# Patient Record
Sex: Female | Born: 1975 | Race: Black or African American | Hispanic: No | Marital: Single | Smoking: Never smoker
Health system: Southern US, Community
[De-identification: ages and names within clinical notes are randomized; demographics above are authoritative.]

## PROBLEM LIST (undated history)

## (undated) ENCOUNTER — Emergency Department (HOSPITAL_BASED_OUTPATIENT_CLINIC_OR_DEPARTMENT_OTHER)
Discharge status: Absent without leave | Payer: BC Managed Care – PPO | Source: Home / Self Care | Attending: Emergency Medicine | Admitting: Emergency Medicine

## (undated) DIAGNOSIS — Z9889 Other specified postprocedural states: Secondary | ICD-10-CM

## (undated) DIAGNOSIS — T7840XA Allergy, unspecified, initial encounter: Secondary | ICD-10-CM

## (undated) DIAGNOSIS — R51 Headache: Secondary | ICD-10-CM

## (undated) DIAGNOSIS — R519 Headache, unspecified: Secondary | ICD-10-CM

## (undated) DIAGNOSIS — D649 Anemia, unspecified: Secondary | ICD-10-CM

## (undated) DIAGNOSIS — E282 Polycystic ovarian syndrome: Secondary | ICD-10-CM

## (undated) DIAGNOSIS — J45909 Unspecified asthma, uncomplicated: Secondary | ICD-10-CM

## (undated) DIAGNOSIS — G4730 Sleep apnea, unspecified: Secondary | ICD-10-CM

## (undated) DIAGNOSIS — R42 Dizziness and giddiness: Secondary | ICD-10-CM

## (undated) DIAGNOSIS — R112 Nausea with vomiting, unspecified: Secondary | ICD-10-CM

## (undated) HISTORY — DX: Polycystic ovarian syndrome: E28.2

## (undated) HISTORY — DX: Allergy, unspecified, initial encounter: T78.40XA

---

## 2009-04-02 HISTORY — PX: MYOMECTOMY: SHX85

## 2014-04-16 DIAGNOSIS — E282 Polycystic ovarian syndrome: Secondary | ICD-10-CM | POA: Insufficient documentation

## 2014-10-07 DIAGNOSIS — O4290 Premature rupture of membranes, unspecified as to length of time between rupture and onset of labor, unspecified weeks of gestation: Secondary | ICD-10-CM

## 2014-11-18 ENCOUNTER — Emergency Department (HOSPITAL_COMMUNITY)
Admission: EM | Admit: 2014-11-18 | Discharge: 2014-11-18 | Disposition: A | Discharge status: Home / Self Care | Payer: BLUE CROSS/BLUE SHIELD | Attending: Emergency Medicine | Admitting: Emergency Medicine

## 2014-11-18 ENCOUNTER — Encounter (HOSPITAL_COMMUNITY): Payer: Self-pay | Admitting: Emergency Medicine

## 2014-11-18 DIAGNOSIS — H9209 Otalgia, unspecified ear: Secondary | ICD-10-CM | POA: Insufficient documentation

## 2014-11-18 DIAGNOSIS — J0101 Acute recurrent maxillary sinusitis: Secondary | ICD-10-CM | POA: Diagnosis not present

## 2014-11-18 DIAGNOSIS — R05 Cough: Secondary | ICD-10-CM | POA: Diagnosis present

## 2014-11-18 DIAGNOSIS — J45909 Unspecified asthma, uncomplicated: Secondary | ICD-10-CM | POA: Insufficient documentation

## 2014-11-18 HISTORY — DX: Unspecified asthma, uncomplicated: J45.909

## 2014-11-18 MED ORDER — FEXOFENADINE-PSEUDOEPHED ER 60-120 MG PO TB12: 1.0000 | ORAL_TABLET | Freq: Two times a day (BID) | ORAL | Status: DC

## 2014-11-18 NOTE — Discharge Instructions (Signed)
Please take the Allegra-D as directed.  This is a decongestion.  Will trial help your pressure and a feeling of stuffiness in your ears make an appointment with Dr. Constance Holster for further evaluation current symptoms

## 2014-11-18 NOTE — ED Notes (Signed)
Pt arrived to the ED with a complaint of a sinus infection.  Pt states that she has had symptoms for a week.  Pt states that she feels if not caught she usually will develop a URI.  Pt states she has a cough.

## 2014-11-18 NOTE — ED Provider Notes (Signed)
CSN: [id number]     Arrival date & time 11/18/14  2242 History  This chart was scribed for non-physician practitioner, Garald Balding, NP, working with Everlene Balls, MD, by Helane Gunther ED Scribe. This patient was seen in room WTR8/WTR8 and the patient's care was started at 11:20 PM    Chief Complaint  Patient presents with  . Cough   The history is provided by the patient. No language interpreter was used.   HPI Comments: Daisy Baker is a 39 y.o. female who presents to the Emergency Department complaining of a cough onset 1 week ago. She reports associated rhinorrhea, congestion, and ear ache. She has taken zyrtec at first with no relief, and then Alegra with some relief. Pt denies fever or discolored nasal discharge.   Past Medical History  Diagnosis Date  . Asthma    No past surgical history on file. No family history on file. Social History  Substance Use Topics  . Smoking status: Never Smoker   . Smokeless tobacco: Never Used  . Alcohol Use: Yes   OB History    No data available     Review of Systems  Constitutional: Negative for fever and chills.  HENT: Positive for congestion, postnasal drip and sinus pressure.   Respiratory: Positive for cough. Negative for shortness of breath and wheezing.     Allergies  Review of patient's allergies indicates no known allergies.  Home Medications   Prior to Admission medications   Medication Sig Start Date End Date Taking? Authorizing Provider  fexofenadine-pseudoephedrine (ALLEGRA-D) 60-120 MG per tablet Take 1 tablet by mouth every 12 (twelve) hours. 11/18/14   Junius Creamer, NP   BP 146/80 mmHg  Pulse 98  Temp(Src) 98.1 F (36.7 C) (Oral)  Resp 21  SpO2 95%  LMP 11/14/2014 (Exact Date) Physical Exam  Constitutional: She appears well-nourished.  HENT:  Head: Normocephalic.  Right Ear: External ear normal.  Left Ear: External ear normal.  Eyes: Pupils are equal, round, and reactive to light.  Neck: Normal range  of motion.  Cardiovascular: Normal rate.   Pulmonary/Chest: Effort normal. No respiratory distress.  Musculoskeletal: Normal range of motion.  Neurological: She is alert.  Skin: Skin is warm and dry.  Nursing note and vitals reviewed.   ED Course  Procedures  DIAGNOSTIC STUDIES: Oxygen Saturation is 95% on RA, adequate by my interpretation.    COORDINATION OF CARE: 11:23 PM - Discussed plans to discharge. Pt advised of plan for treatment and pt agrees.  Labs Review Labs Reviewed - No data to display  Imaging Review No results found. I have personally reviewed and evaluated these images and lab results as part of my medical decision-making.   EKG Interpretation None      MDM   Final diagnoses:  Acute recurrent maxillary sinusitis   I personally performed the services described in this documentation, which was scribed in my presence. The recorded information has been reviewed and is accurate.  Junius Creamer, NP 11/18/14 1224  Everlene Balls, MD 11/19/14 [phone]

## 2015-05-09 ENCOUNTER — Encounter: Payer: Self-pay | Admitting: *Deleted

## 2015-05-10 ENCOUNTER — Ambulatory Visit (INDEPENDENT_AMBULATORY_CARE_PROVIDER_SITE_OTHER): Payer: Medicaid Other | Admitting: Obstetrics & Gynecology

## 2015-05-10 ENCOUNTER — Encounter: Payer: Self-pay | Admitting: Obstetrics & Gynecology

## 2015-05-10 VITALS — BP 126/80 | HR 62 | Ht 63.0 in | Wt 283.0 lb

## 2015-05-10 DIAGNOSIS — D259 Leiomyoma of uterus, unspecified: Secondary | ICD-10-CM | POA: Diagnosis not present

## 2015-05-10 DIAGNOSIS — N938 Other specified abnormal uterine and vaginal bleeding: Secondary | ICD-10-CM | POA: Diagnosis not present

## 2015-05-10 DIAGNOSIS — Z3046 Encounter for surveillance of implantable subdermal contraceptive: Secondary | ICD-10-CM | POA: Diagnosis not present

## 2015-05-10 MED ORDER — NORGESTREL-ETHINYL ESTRADIOL 0.3-30 MG-MCG PO TABS: 1.0000 | ORAL_TABLET | Freq: Every day | ORAL | Status: DC

## 2015-05-10 NOTE — Progress Notes (Signed)
   Subjective:    Patient ID: Daisy Baker, female    DOB: 1975/10/22, 40 y.o.   MRN: [id number]  HPI 30 S AA P1 (58 and 58 month old daughters) here as a referral from the health dept for evaluation of fibroids. She was seen there last month for the issue of irregular bleeding with Nexplanon (placed right after her PLTCS.  She is now only having light irregular bleeding. She has a h/o fibroids and had a [place]omectomy in [place] in [place].  She has always had irregular bleeding, presumably due to her fibroids. She has some "contraction like pain" at times, worse with her periods. She denies dyspareunia. She is frankly quite tired of bleeding all the time.  Review of Systems She had a normal pap smear in [facility] 2016    Objective:   Physical Exam        Assessment & Plan:  Status of fibroids- will need gyn u/s Check TSH Contraception- continue Nexplanon for now, add lo ovral prn Recheck BP in 2 weeks I offered her BTL Consider [place] depending on u/s results

## 2015-05-10 NOTE — Progress Notes (Signed)
Pt here today to follow-up uterine fibroids that are causing pelvic pain and pt is c/o irregular menstrual cycles for the past 3 months.

## 2015-05-11 LAB — TSH: TSH: 0.67 mIU/L

## 2015-05-16 ENCOUNTER — Encounter: Payer: BLUE CROSS/BLUE SHIELD | Admitting: Obstetrics & Gynecology

## 2015-05-18 ENCOUNTER — Ambulatory Visit (HOSPITAL_COMMUNITY)
Admission: RE | Admit: 2015-05-18 | Discharge: 2015-05-18 | Disposition: A | Discharge status: Home / Self Care | Payer: Medicaid Other | Source: Ambulatory Visit | Attending: Obstetrics & Gynecology | Admitting: Obstetrics & Gynecology

## 2015-05-18 DIAGNOSIS — N938 Other specified abnormal uterine and vaginal bleeding: Secondary | ICD-10-CM | POA: Diagnosis present

## 2015-05-18 DIAGNOSIS — D259 Leiomyoma of uterus, unspecified: Secondary | ICD-10-CM

## 2015-05-18 DIAGNOSIS — D252 Subserosal leiomyoma of uterus: Secondary | ICD-10-CM | POA: Diagnosis not present

## 2015-06-10 ENCOUNTER — Encounter: Payer: Self-pay | Admitting: Obstetrics & Gynecology

## 2015-06-10 ENCOUNTER — Ambulatory Visit (INDEPENDENT_AMBULATORY_CARE_PROVIDER_SITE_OTHER): Payer: Medicaid Other | Admitting: Obstetrics & Gynecology

## 2015-06-10 VITALS — BP 118/89 | HR 81 | Resp 16 | Ht 63.0 in | Wt 276.0 lb

## 2015-06-10 DIAGNOSIS — Z3009 Encounter for other general counseling and advice on contraception: Secondary | ICD-10-CM | POA: Diagnosis not present

## 2015-06-10 DIAGNOSIS — N938 Other specified abnormal uterine and vaginal bleeding: Secondary | ICD-10-CM

## 2015-06-10 DIAGNOSIS — D252 Subserosal leiomyoma of uterus: Secondary | ICD-10-CM

## 2015-06-10 NOTE — Progress Notes (Signed)
   Subjective:    Patient ID: Daisy Baker, female    DOB: 01-09-1976, 40 y.o.   MRN: [id number]  HPI  40 yo S AAP2 here for follow up after her u/s done for DUB. It showed a 1.8 cm subserosal fibroid, 13 cm uterus. She is taking the Lo ovral on top of the Nexplanon and is not bleeding!  Review of Systems     Objective:   Physical Exam  WNWHBFNAD Abd- benign      Assessment & Plan:  Desire for sterilization- sign MCD forms and schedule l/s Filsche clip application in [id number] DUB- plan for d&c, endometrial ablation. I am calling the reps to see which one would be better for her size uterus

## 2015-06-22 ENCOUNTER — Encounter (HOSPITAL_COMMUNITY): Payer: Self-pay | Admitting: *Deleted

## 2015-06-24 DIAGNOSIS — G4733 Obstructive sleep apnea (adult) (pediatric): Secondary | ICD-10-CM | POA: Insufficient documentation

## 2015-06-24 DIAGNOSIS — J302 Other seasonal allergic rhinitis: Secondary | ICD-10-CM | POA: Insufficient documentation

## 2015-07-19 ENCOUNTER — Ambulatory Visit (HOSPITAL_COMMUNITY): Admit: 2015-07-19 | Payer: Medicaid Other | Admitting: Obstetrics & Gynecology

## 2015-07-19 ENCOUNTER — Encounter (HOSPITAL_COMMUNITY): Payer: Self-pay

## 2015-07-19 SURGERY — LIGATION, FALLOPIAN TUBE, LAPAROSCOPIC
Anesthesia: Choice | Site: Vagina

## 2015-07-20 ENCOUNTER — Encounter (HOSPITAL_COMMUNITY): Payer: Self-pay | Admitting: *Deleted

## 2015-08-23 NOTE — Patient Instructions (Signed)
Your procedure is scheduled on:  Friday, September 02, 2015  Enter through the [employer] of [facility] at:  7:00 AM  Pick up the phone at the desk and dial [phone].  Call this number if you have problems the morning of surgery: [phone].  Remember: Do NOT eat food or drink after:  Midnight Thursday, September 01, 2015  Take these medicines the morning of surgery with a SIP OF WATER:  None  Do NOT wear jewelry (body piercing), metal hair clips/bobby pins, make-up, or nail polish. Do NOT wear lotions, powders, or perfumes.  You may wear deodorant. Do NOT shave for 48 hours prior to surgery. Do NOT bring valuables to the hospital. Contacts, dentures, or bridgework may not be worn into surgery.  Have a responsible adult drive you home and stay with you for 24 hours after your procedure

## 2015-08-24 ENCOUNTER — Inpatient Hospital Stay (HOSPITAL_COMMUNITY)
Admission: RE | Admit: 2015-08-24 | Discharge: 2015-08-24 | Disposition: A | Discharge status: Home / Self Care | Payer: Medicaid Other | Source: Ambulatory Visit

## 2015-08-31 ENCOUNTER — Encounter (HOSPITAL_COMMUNITY)
Admission: RE | Admit: 2015-08-31 | Discharge: 2015-08-31 | Disposition: A | Discharge status: Home / Self Care | Payer: BC Managed Care – PPO | Source: Ambulatory Visit | Attending: Obstetrics & Gynecology | Admitting: Obstetrics & Gynecology

## 2015-08-31 ENCOUNTER — Encounter (HOSPITAL_COMMUNITY): Payer: Self-pay

## 2015-08-31 DIAGNOSIS — Z01812 Encounter for preprocedural laboratory examination: Secondary | ICD-10-CM | POA: Diagnosis present

## 2015-08-31 HISTORY — DX: Sleep apnea, unspecified: G47.30

## 2015-08-31 HISTORY — DX: Headache: R51

## 2015-08-31 HISTORY — DX: Headache, unspecified: R51.9

## 2015-08-31 HISTORY — DX: Dizziness and giddiness: R42

## 2015-08-31 LAB — CBC
HCT: 37.3 % (ref 36.0–46.0)
Hemoglobin: 12.3 g/dL (ref 12.0–15.0)
MCH: 27.9 pg (ref 26.0–34.0)
MCHC: 33 g/dL (ref 30.0–36.0)
MCV: 84.6 fL (ref 78.0–100.0)
Platelets: 356 10*3/uL (ref 150–400)
RBC: 4.41 MIL/uL (ref 3.87–5.11)
RDW: 14.7 % (ref 11.5–15.5)
WBC: 7.1 10*3/uL (ref 4.0–10.5)

## 2015-08-31 NOTE — Patient Instructions (Signed)
Your procedure is scheduled on:  Friday, September 02, 2015  Enter through the [employer] of [facility] at:  7:00 AM  Pick up the phone at the desk and dial [phone].  Call this number if you have problems the morning of surgery: [phone].  Remember: Do NOT eat food or drink after:  Midnight Thursday  Take these medicines the morning of surgery with a SIP OF WATER:  None  Do NOT wear jewelry (body piercing), metal hair clips/bobby pins, make-up, or nail polish. Do NOT wear lotions, powders, or perfumes.  You may wear deodorant. Do NOT shave for 48 hours prior to surgery. Do NOT bring valuables to the hospital. Contacts, dentures, or bridgework may not be worn into surgery.  Have a responsible adult drive you home and stay with you for 24 hours after your procedure

## 2015-09-02 ENCOUNTER — Encounter (HOSPITAL_COMMUNITY): Payer: Self-pay | Admitting: Obstetrics & Gynecology

## 2015-09-02 ENCOUNTER — Ambulatory Visit (HOSPITAL_COMMUNITY)
Admission: RE | Admit: 2015-09-02 | Discharge: 2015-09-02 | Disposition: A | Discharge status: Home / Self Care | Payer: BC Managed Care – PPO | Source: Ambulatory Visit | Attending: Obstetrics & Gynecology | Admitting: Obstetrics & Gynecology

## 2015-09-02 ENCOUNTER — Ambulatory Visit (HOSPITAL_COMMUNITY): Payer: BC Managed Care – PPO | Admitting: Anesthesiology

## 2015-09-02 ENCOUNTER — Encounter (HOSPITAL_COMMUNITY)
Admission: RE | Disposition: A | Discharge status: Home / Self Care | Payer: Self-pay | Source: Ambulatory Visit | Attending: Obstetrics & Gynecology

## 2015-09-02 ENCOUNTER — Encounter (HOSPITAL_COMMUNITY): Payer: Self-pay | Admitting: Emergency Medicine

## 2015-09-02 DIAGNOSIS — G4730 Sleep apnea, unspecified: Secondary | ICD-10-CM | POA: Diagnosis not present

## 2015-09-02 DIAGNOSIS — Z302 Encounter for sterilization: Secondary | ICD-10-CM | POA: Diagnosis not present

## 2015-09-02 DIAGNOSIS — N938 Other specified abnormal uterine and vaginal bleeding: Secondary | ICD-10-CM | POA: Insufficient documentation

## 2015-09-02 DIAGNOSIS — J45909 Unspecified asthma, uncomplicated: Secondary | ICD-10-CM | POA: Diagnosis not present

## 2015-09-02 HISTORY — PX: DILITATION & CURRETTAGE/HYSTROSCOPY WITH NOVASURE ABLATION: SHX5568

## 2015-09-02 HISTORY — PX: NORPLANT REMOVAL: SHX5385

## 2015-09-02 HISTORY — PX: LAPAROSCOPIC TUBAL LIGATION: SHX1937

## 2015-09-02 LAB — PREGNANCY, URINE: Preg Test, Ur: NEGATIVE

## 2015-09-02 SURGERY — LIGATION, FALLOPIAN TUBE, LAPAROSCOPIC
Anesthesia: General | Site: Vagina

## 2015-09-02 MED ORDER — MIDAZOLAM HCL 2 MG/2ML IJ SOLN
INTRAMUSCULAR | Status: AC
  Filled 2015-09-02: qty 2

## 2015-09-02 MED ORDER — FENTANYL CITRATE (PF) 100 MCG/2ML IJ SOLN
INTRAMUSCULAR | Status: DC | PRN
  Administered 2015-09-02: 50 ug via INTRAVENOUS
  Administered 2015-09-02: 100 ug via INTRAVENOUS

## 2015-09-02 MED ORDER — LIDOCAINE HCL (CARDIAC) 20 MG/ML IV SOLN
INTRAVENOUS | Status: AC
  Filled 2015-09-02: qty 5

## 2015-09-02 MED ORDER — DEXAMETHASONE SODIUM PHOSPHATE 10 MG/ML IJ SOLN
INTRAMUSCULAR | Status: DC | PRN
  Administered 2015-09-02: 4 mg via INTRAVENOUS

## 2015-09-02 MED ORDER — BUPIVACAINE HCL 0.5 % IJ SOLN
INTRAMUSCULAR | Status: DC | PRN
  Administered 2015-09-02: 5 mL

## 2015-09-02 MED ORDER — FENTANYL CITRATE (PF) 250 MCG/5ML IJ SOLN
INTRAMUSCULAR | Status: AC
  Filled 2015-09-02: qty 5

## 2015-09-02 MED ORDER — PROPOFOL 10 MG/ML IV BOLUS
INTRAVENOUS | Status: AC
  Filled 2015-09-02: qty 20

## 2015-09-02 MED ORDER — SCOPOLAMINE 1 MG/3DAYS TD PT72
1.0000 | MEDICATED_PATCH | Freq: Once | TRANSDERMAL | Status: DC
Start: 2015-09-02 — End: 2015-09-02
  Administered 2015-09-02: 1.5 mg via TRANSDERMAL

## 2015-09-02 MED ORDER — OXYCODONE-ACETAMINOPHEN 5-325 MG PO TABS: 1.0000 | ORAL_TABLET | Freq: Four times a day (QID) | ORAL | Status: DC | PRN

## 2015-09-02 MED ORDER — ROCURONIUM BROMIDE 100 MG/10ML IV SOLN
INTRAVENOUS | Status: DC | PRN
  Administered 2015-09-02: 10 mg via INTRAVENOUS
  Administered 2015-09-02: 40 mg via INTRAVENOUS

## 2015-09-02 MED ORDER — LIDOCAINE HCL (CARDIAC) 20 MG/ML IV SOLN
INTRAVENOUS | Status: DC | PRN
  Administered 2015-09-02: 100 mg via INTRAVENOUS

## 2015-09-02 MED ORDER — SUGAMMADEX SODIUM 200 MG/2ML IV SOLN
INTRAVENOUS | Status: DC | PRN
  Administered 2015-09-02: 247 mg via INTRAVENOUS

## 2015-09-02 MED ORDER — IBUPROFEN 600 MG PO TABS: 600.0000 mg | ORAL_TABLET | Freq: Four times a day (QID) | ORAL | Status: DC | PRN

## 2015-09-02 MED ORDER — ROCURONIUM BROMIDE 100 MG/10ML IV SOLN
INTRAVENOUS | Status: AC
  Filled 2015-09-02: qty 1

## 2015-09-02 MED ORDER — SUGAMMADEX SODIUM 200 MG/2ML IV SOLN
INTRAVENOUS | Status: AC
  Filled 2015-09-02: qty 4

## 2015-09-02 MED ORDER — HYDROCODONE-ACETAMINOPHEN 7.5-325 MG PO TABS
1.0000 | ORAL_TABLET | Freq: Once | ORAL | Status: AC | PRN
  Administered 2015-09-02: 1 via ORAL

## 2015-09-02 MED ORDER — ONDANSETRON HCL 4 MG/2ML IJ SOLN
INTRAMUSCULAR | Status: AC
  Filled 2015-09-02: qty 2

## 2015-09-02 MED ORDER — ONDANSETRON HCL 4 MG/2ML IJ SOLN
INTRAMUSCULAR | Status: DC | PRN
  Administered 2015-09-02: 4 mg via INTRAVENOUS

## 2015-09-02 MED ORDER — PROMETHAZINE HCL 25 MG/ML IJ SOLN: 6.2500 mg | INTRAMUSCULAR | Status: DC | PRN

## 2015-09-02 MED ORDER — DEXAMETHASONE SODIUM PHOSPHATE 4 MG/ML IJ SOLN
INTRAMUSCULAR | Status: AC
  Filled 2015-09-02: qty 1

## 2015-09-02 MED ORDER — METOCLOPRAMIDE HCL 5 MG/ML IJ SOLN
10.0000 mg | Freq: Once | INTRAMUSCULAR | Status: AC
  Administered 2015-09-02: 10 mg via INTRAVENOUS

## 2015-09-02 MED ORDER — MIDAZOLAM HCL 2 MG/2ML IJ SOLN
INTRAMUSCULAR | Status: DC | PRN
  Administered 2015-09-02: 2 mg via INTRAVENOUS

## 2015-09-02 MED ORDER — LACTATED RINGERS IV SOLN
INTRAVENOUS | Status: DC
  Administered 2015-09-02 (×2): via INTRAVENOUS

## 2015-09-02 MED ORDER — BUPIVACAINE HCL (PF) 0.5 % IJ SOLN
INTRAMUSCULAR | Status: AC
  Filled 2015-09-02: qty 30

## 2015-09-02 MED ORDER — PROPOFOL 10 MG/ML IV BOLUS
INTRAVENOUS | Status: DC | PRN
  Administered 2015-09-02: 200 mg via INTRAVENOUS

## 2015-09-02 MED ORDER — METOCLOPRAMIDE HCL 5 MG/ML IJ SOLN
INTRAMUSCULAR | Status: AC
  Administered 2015-09-02: 10 mg via INTRAVENOUS
  Filled 2015-09-02: qty 2

## 2015-09-02 MED ORDER — SCOPOLAMINE 1 MG/3DAYS TD PT72
MEDICATED_PATCH | TRANSDERMAL | Status: AC
  Administered 2015-09-02: 1.5 mg via TRANSDERMAL
  Filled 2015-09-02: qty 1

## 2015-09-02 MED ORDER — FENTANYL CITRATE (PF) 100 MCG/2ML IJ SOLN
25.0000 ug | INTRAMUSCULAR | Status: DC | PRN
  Administered 2015-09-02 (×3): 25 ug via INTRAVENOUS

## 2015-09-02 MED ORDER — KETOROLAC TROMETHAMINE 30 MG/ML IJ SOLN
30.0000 mg | Freq: Once | INTRAMUSCULAR | Status: AC | PRN
  Administered 2015-09-02: 30 mg via INTRAVENOUS

## 2015-09-02 MED ORDER — FENTANYL CITRATE (PF) 100 MCG/2ML IJ SOLN
INTRAMUSCULAR | Status: AC
  Filled 2015-09-02: qty 2

## 2015-09-02 MED ORDER — KETOROLAC TROMETHAMINE 30 MG/ML IJ SOLN
INTRAMUSCULAR | Status: AC
  Filled 2015-09-02: qty 1

## 2015-09-02 MED ORDER — HYDROCODONE-ACETAMINOPHEN 7.5-325 MG PO TABS
ORAL_TABLET | ORAL | Status: AC
  Filled 2015-09-02: qty 1

## 2015-09-02 MED ORDER — KETOROLAC TROMETHAMINE 30 MG/ML IJ SOLN
INTRAMUSCULAR | Status: AC
  Administered 2015-09-02: 30 mg via INTRAVENOUS
  Filled 2015-09-02: qty 1

## 2015-09-02 SURGICAL SUPPLY — 40 items
ABLATOR ENDOMETRIAL BIPOLAR (ABLATOR) ×5 IMPLANT
BANDAGE ACE 6X5 VEL STRL LF (GAUZE/BANDAGES/DRESSINGS) ×5 IMPLANT
CATH ROBINSON RED A/P 16FR (CATHETERS) ×5 IMPLANT
CLOTH BEACON ORANGE TIMEOUT ST (SAFETY) ×5 IMPLANT
DERMABOND ADVANCED (GAUZE/BANDAGES/DRESSINGS) ×4
DERMABOND ADVANCED .7 DNX12 (GAUZE/BANDAGES/DRESSINGS) ×6 IMPLANT
DRSG OPSITE 4X5.5 SM (GAUZE/BANDAGES/DRESSINGS) ×5 IMPLANT
DRSG OPSITE POSTOP 3X4 (GAUZE/BANDAGES/DRESSINGS) IMPLANT
DURAPREP 26ML APPLICATOR (WOUND CARE) ×5 IMPLANT
GLOVE BIO SURGEON STRL SZ 6.5 (GLOVE) ×4 IMPLANT
GLOVE BIO SURGEONS STRL SZ 6.5 (GLOVE) ×1
GLOVE BIOGEL PI IND STRL 7.0 (GLOVE) ×3 IMPLANT
GLOVE BIOGEL PI INDICATOR 7.0 (GLOVE) ×2
GOWN STRL REUS W/TWL LRG LVL3 (GOWN DISPOSABLE) ×15 IMPLANT
NDL SAFETY ECLIPSE 18X1.5 (NEEDLE) ×3 IMPLANT
NEEDLE HYPO 18GX1.5 SHARP (NEEDLE) ×2
NEEDLE INSUFFLATION 120MM (ENDOMECHANICALS) ×5 IMPLANT
NEEDLE INSUFFLATION 150MM (ENDOMECHANICALS) ×5 IMPLANT
NEEDLE SPNL 18GX3.5 QUINCKE PK (NEEDLE) ×5 IMPLANT
NS IRRIG 1000ML POUR BTL (IV SOLUTION) ×5 IMPLANT
PACK LAPAROSCOPY BASIN (CUSTOM PROCEDURE TRAY) ×5 IMPLANT
PACK VAGINAL MINOR WOMEN LF (CUSTOM PROCEDURE TRAY) ×5 IMPLANT
PAD OB MATERNITY 4.3X12.25 (PERSONAL CARE ITEMS) ×5 IMPLANT
PAD TRENDELENBURG POSITION (MISCELLANEOUS) ×5 IMPLANT
SCALPEL HARMONIC ACE (MISCELLANEOUS) ×5 IMPLANT
SLEEVE XCEL OPT CAN 5 100 (ENDOMECHANICALS) ×4 IMPLANT
SUT VICRYL 0 UR6 27IN ABS (SUTURE) ×5 IMPLANT
SUT VICRYL 2 0 18  UND BR (SUTURE) ×2
SUT VICRYL 2 0 18 UND BR (SUTURE) ×3 IMPLANT
SUT VICRYL 4-0 PS2 18IN ABS (SUTURE) ×5 IMPLANT
SYR 30ML LL (SYRINGE) ×5 IMPLANT
TOWEL OR 17X24 6PK STRL BLUE (TOWEL DISPOSABLE) ×10 IMPLANT
TROCAR 5M 150ML BLDLS (TROCAR) ×5 IMPLANT
TROCAR OPTI TIP 5M 100M (ENDOMECHANICALS) ×5 IMPLANT
TROCAR XCEL DIL TIP R 11M (ENDOMECHANICALS) ×5 IMPLANT
TROCAR XCEL NON-BLD 5MMX100MML (ENDOMECHANICALS) ×5 IMPLANT
TROCAR XCEL OPT SLVE 5M 100M (ENDOMECHANICALS) ×5 IMPLANT
TUBING AQUILEX INFLOW (TUBING) ×5 IMPLANT
TUBING AQUILEX OUTFLOW (TUBING) ×5 IMPLANT
WATER STERILE IRR 1000ML POUR (IV SOLUTION) ×5 IMPLANT

## 2015-09-02 NOTE — Op Note (Signed)
PATIENT: Chief Financial Officer   PRE-OPERATIVE DIAGNOSIS: DUB, DESIRE FOR STERILITY, MORBID OBESITY  POST-OPERATIVE DIAGNOSIS: SAME + DENSE PELVIC ADHESIONS  PROCEDURE:  LAPAROSCOPY, LYSIS OF ADHESIONS, BILATERAL SALPINGECTOMY DILATION AND CURETTAGE, ATTEMPTED NOVASURE ENDOMETRIAL ABLATION REMOVAL OF NEXPLANON  SURGEON: Surgeon(s) and Role:  * Emily Filbert, MD - Primary  PHYSICIAN ASSISTANT:   ASSISTANTS: none   ANESTHESIA: general  EBL:  minimal  BLOOD ADMINISTERED:none  DRAINS: none   LOCAL MEDICATIONS USED: MARCAINE   SPECIMEN: Source of Specimen: both oviducts, endometrial curettings  DISPOSITION OF SPECIMEN: PATHOLOGY  COUNTS: YES  TOURNIQUET: * No tourniquets in log *  DICTATION: .Dragon Dictation  PLAN OF CARE: Discharge to home after PACU  PATIENT DISPOSITION: PACU - hemodynamically stable.  Delay start of Pharmacological VTE agent (>24hrs) due to surgical blood loss or risk of bleeding: not applicable  The risks, benefits, and alternatives of surgery were explained, understood, accepted. She is certain that she wants permanent sterility. She understands that this is not reversible. She understands there is a small failure rate of this procedure. She also would like to have both oviducts removed her due newest recommendations to help prevent ovarian/peritoneal cancer. In the operating room she was placed in the dorsal lithotomy position, and general anesthesia was given without complication. Her abdomen and vagina were prepped and draped in the usual sterile fashion. A timeout procedure was done. A bimanual exam revealed a 12 week size non-mobile uterus. Her adnexa felt normal. A Hulka manipulator was placed. Her bladder was emptied with a Robinson catheter. Gloves were changed, and attention was turned to the abdomen. Approximately the 5 mL of 0.5% Marcaine was injected into all incisions sites. An OG tube was placed. An incision was made in the  left upper quadrant as I was very suspicious that she might have adhesions from her previous myomectomy and cesarean section. A Veress needle was placed intraperitoneally. I had to use the extra long one as the normal one would not reach her peritoneal cavity. Low-flow CO2 was used to insufflate the abdomen to approximately 3-1/2 L. Patient abdominal pressure was always less than 15. Once a good pneumoperitoneum was established, a 5 mm Excel trocar was placed. Laparoscopy confirmed correct placement. A 5 mm port was placed in the left lower quadrant under direct laparoscopic visualization. Her uterus was essentially completely stuck up to the anterior abdominal wall with the omentum in between. The left adnexa was densely adherent to the anterior abdominal wall and the right adnexa was not visible. Using the Harmonic scapel, I did extensive lysis of adhesions, eventually allowing the uterus to separate from the abdominal wall. Hemostasis was maintained throughout. I was then able to place a 10 mm port in the right lower quadrant under direct visualization. A Harmonic scapel was used to release the left adnexa from the anterior abdominal wall. I then removed the left distal end of the tube. The remainder of the tube was densely adherent to the omentum/uterine complex. I was then able to see the right adnexa and remove the entire right oviduct. I removed the oviducts through the 10 mm port. The fascia of the 10 mm port was closed with a 0 vicryl suture using the [employer] fascial closure device. No defects were palpable. The CO2 was allowed to escape from the abdomin. The 5 mm ports were removed. I closed the deep subQ tissue at the 10 mm port site with a 2-0 vicryl suture and then did a subcuticular closure was done with  4-0 Vicryl suture there. The 5 mm port sites were closed with Dermabond. A Steri-Strip was placed across each incision. I then proceeded with the d&c.   Marland Kitchen A speculum was placed and a  single-tooth tenaculum was used to grasp the anterior lip of her cervix. A total of 20 mL of 0.5% Marcaine was used to perform a paracervical block. Her uterus sounded to 14 cm. Her cervix was carefully and slowly dilated to accommodate a small curette. A curettage was done in all quadrants and the fundus of the uterus. A large amount of polypoid-type tissue was obtained. A gritty sensation was appreciated throughout. I then placed the Novasure device, with the cavity length being measured at 9 cm. The Novasure device did not pass its test. I didn't think that the HTA would work on a uterus of her size. I therefore stopped the procedure and removed her intact Nexplanon after sterilely prepping her arm. A steristrip was used to close the incision. There was no bleeding noted at the end of the case. She was taken to the recovery room after being extubated. She tolerated the procedure well.   She was extubated and taken to the recovery room in stable condition.

## 2015-09-02 NOTE — Anesthesia Postprocedure Evaluation (Signed)
Anesthesia Post Note  Patient: Chief Financial Officer  Procedure(s) Performed: Procedure(s) (LRB): LAPAROSCOPIC TUBAL LIGATION, LYSIS OF ADHESIONS (Bilateral) DILATATION & CURETTAGE/HYSTEROSCOPY WITH NOVASURE ABLATION (N/A) REMOVAL OF NORPLANT (Left)  Patient location during evaluation: PACU Anesthesia Type: General Level of consciousness: awake and alert Pain management: pain level controlled Vital Signs Assessment: post-procedure vital signs reviewed and stable Respiratory status: spontaneous breathing, nonlabored ventilation, respiratory function stable and patient connected to nasal cannula oxygen Cardiovascular status: blood pressure returned to baseline and stable Postop Assessment: no signs of nausea or vomiting Anesthetic complications: no     Last Vitals:  Filed Vitals:   09/02/15 1045 09/02/15 1100  BP: 119/81 131/95  Pulse: 96 99  Temp:  36.7 C  Resp: 21 25    Last Pain:  Filed Vitals:   09/02/15 1115  PainSc: 10-Worst pain ever   Pain Goal: Patients Stated Pain Goal: 3 (09/02/15 1100)               Montez Hageman

## 2015-09-02 NOTE — Anesthesia Preprocedure Evaluation (Addendum)
Anesthesia Evaluation  Patient identified by MRN, date of birth, ID band Patient awake    Reviewed: Allergy & Precautions, NPO status , Patient's Chart, lab work & pertinent test results  Airway Mallampati: II  TM Distance: >3 FB Neck ROM: Full    Dental  (+) Dental Advisory Given   Pulmonary asthma , sleep apnea ,    Pulmonary exam normal        Cardiovascular negative cardio ROS Normal cardiovascular exam     Neuro/Psych negative neurological ROS     GI/Hepatic negative GI ROS, Neg liver ROS,   Endo/Other  negative endocrine ROS  Renal/GU negative Renal ROS     Musculoskeletal   Abdominal   Peds  Hematology negative hematology ROS (+)   Anesthesia Other Findings   Reproductive/Obstetrics                            Lab Results  Component Value Date   WBC 7.1 08/31/2015   HGB 12.3 08/31/2015   HCT 37.3 08/31/2015   MCV 84.6 08/31/2015   PLT 356 08/31/2015    Anesthesia Physical Anesthesia Plan  ASA: III  Anesthesia Plan: General   Post-op Pain Management:    Induction: Intravenous  Airway Management Planned: Oral ETT  Additional Equipment:   Intra-op Plan:   Post-operative Plan: Extubation in OR  Informed Consent: I have reviewed the patients History and Physical, chart, labs and discussed the procedure including the risks, benefits and alternatives for the proposed anesthesia with the patient or authorized representative who has indicated his/her understanding and acceptance.   Dental advisory given  Plan Discussed with: CRNA  Anesthesia Plan Comments:        Anesthesia Quick Evaluation

## 2015-09-02 NOTE — Anesthesia Procedure Notes (Signed)
Procedure Name: Intubation Date/Time: 09/02/2015 8:31 AM Performed by: Hewitt Blade Pre-anesthesia Checklist: Patient identified, Emergency Drugs available, Suction available and Patient being monitored Patient Re-evaluated:Patient Re-evaluated prior to inductionOxygen Delivery Method: Circle system utilized Preoxygenation: Pre-oxygenation with 100% oxygen Intubation Type: IV induction Ventilation: Mask ventilation without difficulty and Oral airway inserted - appropriate to patient size Laryngoscope Size: Mac and 3 Grade View: Grade I Tube type: Oral Tube size: 7.0 mm Number of attempts: 1 Airway Equipment and Method: Stylet Placement Confirmation: ETT inserted through vocal cords under direct vision,  positive ETCO2 and breath sounds checked- equal and bilateral Secured at: 21 cm Tube secured with: Tape Dental Injury: Teeth and Oropharynx as per pre-operative assessment

## 2015-09-02 NOTE — H&P (Signed)
Daisy Baker is an 18 S AA P52 (56 and 44 month old daughters) here as a referral from the health dept for evaluation of fibroids. She was seen there last month for the issue of irregular bleeding with Nexplanon (placed right after her PLTCS. She is now only having light irregular bleeding. She has a h/o fibroids and had a [place]omectomy in [place] in 2011.  She has always had irregular bleeding, presumably due to her fibroids. She has some "contraction like pain" at times, worse with her periods. She denies dyspareunia. She is frankly quite tired of bleeding all the time. We added Lo ovral but she is still bleeding. She also wants permanent sterility. She is quite certain about this and understands that there is a failure rate.  Her u/s showed Small subserosal fibroid anteriorly in the lower uterine segment Region. The uterus is 13 cm in length.   Patient's last menstrual period was 08/30/2015.    Past Medical History  Diagnosis Date  . Asthma   . Allergy   . Sleep apnea   . Headache     Migraines  . Vertigo     Past Surgical History  Procedure Laterality Date  . Cesarean section    . Myomectomy  2011    Family History  Problem Relation Age of Onset  . Hypertension Mother   . Diabetes Mother   . Asthma Mother   . Hypertension Father   . Diabetes Father     Social History:  reports that she has never smoked. She has never used smokeless tobacco. She reports that she drinks alcohol. She reports that she does not use illicit drugs.  Allergies: No Known Allergies  Prescriptions prior to admission  Medication Sig Dispense Refill Last Dose  . fexofenadine-pseudoephedrine (ALLEGRA-D) 60-120 MG per tablet Take 1 tablet by mouth every 12 (twelve) hours. 30 tablet 0 09/01/2015 at Unknown time  . norgestrel-ethinyl estradiol (LO/OVRAL,CRYSELLE) 0.3-30 MG-MCG tablet Take 1 tablet by mouth daily. 1 Package 11 09/01/2015 at Unknown time  . etonogestrel (NEXPLANON) 68 MG IMPL implant 1 each  by Subdermal route once.   Taking    ROS  Blood pressure 131/85, pulse 82, temperature 98 F (36.7 C), temperature source Oral, resp. rate 16, last menstrual period 08/30/2015, SpO2 99 %. Physical Exam Heart- rrr Lungs- CTAB Abd- benign Results for orders placed or performed during the hospital encounter of 09/02/15 (from the past 24 hour(s))  Pregnancy, urine     Status: None   Collection Time: 09/02/15  7:00 AM  Result Value Ref Range   Preg Test, Ur NEGATIVE NEGATIVE    No results found.  Assessment/Plan: DUB- plan for d&c and endometrial ablation Desire for sterility- plan for laparoscopic bilateral salpingectomy  She understands the risks of surgery, including, but not to infection, bleeding, DVTs, damage to bowel, bladder, ureters. She wishes to proceed.     Charnetta Wulff C. 09/02/2015, 8:21 AM

## 2015-09-02 NOTE — Transfer of Care (Signed)
Immediate Anesthesia Transfer of Care Note  Patient: Chief Financial Officer  Procedure(s) Performed: Procedure(s): LAPAROSCOPIC TUBAL LIGATION, LYSIS OF ADHESIONS (Bilateral) DILATATION & CURETTAGE/HYSTEROSCOPY WITH NOVASURE ABLATION (N/A)  Patient Location: PACU  Anesthesia Type:General  Level of Consciousness: awake, alert  and sedated  Airway & Oxygen Therapy: Patient Spontanous Breathing and Patient connected to nasal cannula oxygen  Post-op Assessment: Report given to RN, Post -op Vital signs reviewed and stable and Patient moving all extremities  Post vital signs: Reviewed and stable  Last Vitals:  Filed Vitals:   09/02/15 0727  BP: 131/85  Pulse: 82  Temp: 36.7 C  Resp: 16    Last Pain:  Filed Vitals:   09/02/15 0728  PainSc: 5       Patients Stated Pain Goal: 3 ([id number])  Complications: No apparent anesthesia complications

## 2015-09-02 NOTE — Discharge Instructions (Signed)
Dilation and Curettage or Vacuum Curettage, Care After Refer to this sheet in the next few weeks. These instructions provide you with information on caring for yourself after your procedure. Your health care provider may also give you more specific instructions. Your treatment has been planned according to current medical practices, but problems sometimes occur. Call your health care provider if you have any problems or questions after your procedure. WHAT TO EXPECT AFTER THE PROCEDURE After your procedure, it is typical to have light cramping and bleeding. This may last for 2 days to 2 weeks after the procedure. HOME CARE INSTRUCTIONS   Do not drive for 24 hours.  Wait 1 week before returning to strenuous activities.  Take your temperature 2 times a day for 4 days and write it down. Provide these temperatures to your health care provider if you develop a fever.  Avoid long periods of standing.  Avoid heavy lifting, pushing, or pulling. Do not lift anything heavier than 10 pounds (4.5 kg).  Limit stair climbing to once or twice a day.  Take rest periods often.  You may resume your usual diet.  Drink enough fluids to keep your urine clear or pale yellow.  Your usual bowel function should return. If you have constipation, you may:  Take a mild laxative with permission from your health care provider.  Add fruit and bran to your diet.  Drink more fluids.  Take showers instead of baths until your health care provider gives you permission to take baths.  Do not go swimming or use a hot tub until your health care provider approves.  Try to have someone with you or available to you the first 24-48 hours, especially if you were given a general anesthetic.  Do not douche, use tampons, or have sex (intercourse) for 2 weeks after the procedure.  Only take over-the-counter or prescription medicines as directed by your health care provider. Do not take aspirin. It can cause  bleeding.  Follow up with your health care provider as directed. SEEK MEDICAL CARE IF:   You have increasing cramps or pain that is not relieved with medicine.  You have abdominal pain that does not seem to be related to the same area of earlier cramping and pain.  You have bad smelling vaginal discharge.  You have a rash.  You are having problems with any medicine. SEEK IMMEDIATE MEDICAL CARE IF:   You have bleeding that is heavier than a normal menstrual period.  You have a fever.  You have chest pain.  You have shortness of breath.  You feel dizzy or feel like fainting.  You pass out.  You have pain in your shoulder strap area.  You have heavy vaginal bleeding with or without blood clots. MAKE SURE YOU:   Understand these instructions.  Will watch your condition.  Will get help right away if you are not doing well or get worse.   This information is not intended to replace advice given to you by your health care provider. Make sure you discuss any questions you have with your health care provider.   Document Released: 03/16/2000 Document Revised: 03/24/2013 Document Reviewed: 10/16/2012 Elsevier Interactive Patient Education 2016 [employer] Diagnostic Laparoscopy A diagnostic laparoscopy is a procedure to diagnose diseases in the abdomen. During the procedure, a thin, lighted, pencil-sized instrument called a laparoscope is inserted into the abdomen through an incision. The laparoscope allows your health care provider to look at the organs inside your body. [place]  CARE PROVIDER KNOW ABOUT:  Any allergies you have.  All medicines you are taking, including vitamins, herbs, eye drops, creams, and over-the-counter medicines.  Previous problems you or members of your family have had with the use of anesthetics.  Any blood disorders you have.  Previous surgeries you have had.  Medical conditions you have. RISKS AND COMPLICATIONS  Generally, this is  a safe procedure. However, problems can occur, which may include:  Infection.  Bleeding.  Damage to other organs.  Allergic reaction to the anesthetics used during the procedure. BEFORE THE PROCEDURE  Do not eat or drink anything after midnight on the night before the procedure or as directed by your health care provider.  Ask your health care provider about:  Changing or stopping your regular medicines.  Taking medicines such as aspirin and ibuprofen. These medicines can thin your blood. Do not take these medicines before your procedure if your health care provider instructs you not to.  Plan to have someone take you home after the procedure. PROCEDURE  You may be given a medicine to help you relax (sedative).  You will be given a medicine to make you sleep (general anesthetic).  Your abdomen will be inflated with a gas. This will make your organs easier to see.  Small incisions will be made in your abdomen.  A laparoscope and other small instruments will be inserted into the abdomen through the incisions.  A tissue sample may be removed from an organ in the abdomen for examination.  The instruments will be removed from the abdomen.  The gas will be released.  The incisions will be closed with stitches (sutures). AFTER THE PROCEDURE  Your blood pressure, heart rate, breathing rate, and blood oxygen level will be monitored often until the medicines you were given have worn off.   This information is not intended to replace advice given to you by your health care provider. Make sure you discuss any questions you have with your health care provider.   Document Released: 06/25/2000 Document Revised: 12/08/2014 Document Reviewed: 10/30/2013 Elsevier Interactive Patient Education 2016 [place] INSTRUCTIONS: Laparoscopy  The following instructions have been prepared to help you care for yourself upon your return home today.  Wound care:  Do not get the  incision wet for the first 24 hours. The incision should be kept clean and dry.  The Band-Aids or dressings may be removed the day after surgery.  Should the incision become sore, red, and swollen after the first week, check with your doctor.  Personal hygiene:  Shower the day after your procedure.  Activity and limitations:  Do NOT drive or operate any equipment today.  Do NOT lift anything more than 15 pounds for 2-3 weeks after surgery.  Do NOT rest in bed all day.  Walking is encouraged. Walk each day, starting slowly with 5-minute walks 3 or 4 times a day. Slowly increase the length of your walks.  Walk up and down stairs slowly.  Do NOT do strenuous activities, such as golfing, playing tennis, bowling, running, biking, weight lifting, gardening, mowing, or vacuuming for 2-4 weeks. Ask your doctor when it is okay to start.  Diet: Eat a light meal as desired this evening. You may resume your usual diet tomorrow.  Return to work: This is dependent on the type of work you do. For the most part you can return to a desk job within a week of surgery. If you are more active at work, please discuss  this with your doctor.  What to expect after your surgery: You may have a slight burning sensation when you urinate on the first day. You may have a very small amount of blood in the urine. Expect to have a small amount of vaginal discharge/light bleeding for 1-2 weeks. It is not unusual to have abdominal soreness and bruising for up to 2 weeks. You may be tired and need more rest for about 1 week. You may experience shoulder pain for 24-72 hours. Lying flat in bed may relieve it.  Call your doctor for any of the following:  Develop a fever of 100.4 or greater  Inability to urinate 6 hours after discharge from hospital  Severe pain not relieved by pain medications  Persistent of heavy bleeding at incision site  Redness or swelling around incision site after a week  Increasing nausea  or vomiting  You may remove the patch behind your ear on or before 09/05/15.  Wash your hands with soap and water after contact with the patch.  Do not touch your eyes after contact with the patch.  Patient Signature________________________________________ Nurse Signature_________________________________________

## 2015-09-05 ENCOUNTER — Encounter (HOSPITAL_COMMUNITY): Payer: Self-pay | Admitting: Obstetrics & Gynecology

## 2015-09-12 ENCOUNTER — Ambulatory Visit (INDEPENDENT_AMBULATORY_CARE_PROVIDER_SITE_OTHER): Payer: BC Managed Care – PPO | Admitting: Obstetrics & Gynecology

## 2015-09-12 VITALS — BP 135/82 | HR 90 | Resp 20 | Wt 269.0 lb

## 2015-09-12 DIAGNOSIS — G8918 Other acute postprocedural pain: Secondary | ICD-10-CM

## 2015-09-12 DIAGNOSIS — M549 Dorsalgia, unspecified: Secondary | ICD-10-CM | POA: Diagnosis not present

## 2015-09-12 LAB — CBC
HCT: 39 % (ref 35.0–45.0)
Hemoglobin: 12.3 g/dL (ref 11.7–15.5)
MCH: 27.5 pg (ref 27.0–33.0)
MCHC: 31.5 g/dL — ABNORMAL LOW (ref 32.0–36.0)
MCV: 87.2 fL (ref 80.0–100.0)
MPV: 9.4 fL (ref 7.5–12.5)
Platelets: 331 10*3/uL (ref 140–400)
RBC: 4.47 MIL/uL (ref 3.80–5.10)
RDW: 14.9 % (ref 11.0–15.0)
WBC: 5.6 10*3/uL (ref 3.8–10.8)

## 2015-09-12 LAB — POCT URINALYSIS DIPSTICK
Bilirubin, UA: NEGATIVE
Glucose, UA: NEGATIVE
Ketones, UA: NEGATIVE
Leukocytes, UA: NEGATIVE
Nitrite, UA: NEGATIVE
Spec Grav, UA: 1.025
Urobilinogen, UA: 0.2
pH, UA: 5

## 2015-09-12 MED ORDER — TRAMADOL HCL 50 MG PO TABS: 50.0000 mg | ORAL_TABLET | Freq: Four times a day (QID) | ORAL | Status: DC | PRN

## 2015-09-12 NOTE — Progress Notes (Signed)
Pt had surgery on 09-02-15, started experiencing right lower back pain that is progressively getting worse since Wednesday.

## 2015-09-12 NOTE — Progress Notes (Signed)
   Subjective:    Patient ID: Daisy Baker, female    DOB: [birth year], 40 y.o.   MRN: [id number]  HPI  40 yo AA lady here 10 days after a LOA/LBS/attempted endometrial ablation. She reports a constant dull pain of her right lower back since Wednesday. She has taken IBU and Tylenol but reports that she has not taken the Percocet as she has had a out of body experience in the past with percocet. She reports normal bowel and bladder function.  Review of Systems     Objective:   Physical Exam Pleasant morbidly obese BFNAD Breathing, conversing, and ambulating normally I beat her all over her back quite firmly but did not elicit the pain. Her incisions are healing well Abd- benign       Assessment & Plan:  Deep right back pain 10 days post op with no obvious etiology Check a CBC, UC&S Prescribe tramadol

## 2015-09-13 LAB — URINE CULTURE
Colony Count: NO GROWTH
Organism ID, Bacteria: NO GROWTH

## 2015-10-11 ENCOUNTER — Encounter: Payer: BC Managed Care – PPO | Admitting: Obstetrics & Gynecology

## 2016-01-24 ENCOUNTER — Ambulatory Visit (INDEPENDENT_AMBULATORY_CARE_PROVIDER_SITE_OTHER): Payer: BC Managed Care – PPO | Admitting: Obstetrics & Gynecology

## 2016-01-24 ENCOUNTER — Encounter: Payer: Self-pay | Admitting: Obstetrics & Gynecology

## 2016-01-24 VITALS — BP 122/85 | HR 75 | Ht 63.0 in | Wt 279.0 lb

## 2016-01-24 DIAGNOSIS — N920 Excessive and frequent menstruation with regular cycle: Secondary | ICD-10-CM | POA: Diagnosis not present

## 2016-01-24 DIAGNOSIS — D259 Leiomyoma of uterus, unspecified: Secondary | ICD-10-CM | POA: Diagnosis not present

## 2016-01-24 DIAGNOSIS — Z113 Encounter for screening for infections with a predominantly sexual mode of transmission: Secondary | ICD-10-CM

## 2016-01-24 DIAGNOSIS — Z1151 Encounter for screening for human papillomavirus (HPV): Secondary | ICD-10-CM

## 2016-01-24 DIAGNOSIS — N898 Other specified noninflammatory disorders of vagina: Secondary | ICD-10-CM

## 2016-01-24 DIAGNOSIS — Z124 Encounter for screening for malignant neoplasm of cervix: Secondary | ICD-10-CM

## 2016-01-24 DIAGNOSIS — Z0000 Encounter for general adult medical examination without abnormal findings: Secondary | ICD-10-CM

## 2016-01-24 MED ORDER — METRONIDAZOLE 500 MG PO TABS: 500.0000 mg | ORAL_TABLET | Freq: Two times a day (BID) | ORAL | 0 refills | Status: DC

## 2016-01-24 NOTE — Progress Notes (Signed)
   Subjective:    Patient ID: Daisy Baker, female    DOB: 17-Apr-1975, 40 y.o.   MRN: [id number]  HPI  40 yo SAA P2 here with the continued complaint of heavy monthly bleeding, lasting 3-4 days per month. She tried Nexplanon, OCPs with no help. She wears pads and tampons at the same time. She sometimes has to change clothes at work due to bleeding. I did a laparoscopic BS and noted extremely dense pelvic adhesions. I did extensive lysis of adhesions. I then attempted a Novasure ablation after a d&c but the procedure could not proceed due to failure of the device to pass its test. She generally does not have pelvic pain, just cramping with periods.   Her other concern is vaginal dryness, not getting vaginal lubricant with arousal for several years. Same partner. Sometimes able to have an orgasm.  She also complains of a smelly vaginal discharge.   Review of Systems     Objective:   Physical Exam Vulvar- normal, no atrophy Vagina- discharge c/w BV NO prolapse       Assessment & Plan:  Vaginal dryness- check FSH Menorrhagia/fibroids- check CBC She would like to have a hysterectomy (TAH). She declines Mirena. She would like this in summer 2018 so she will call around the middle of March the office to get this scheduled Flagyl prescribed, no etoh

## 2016-01-24 NOTE — Addendum Note (Signed)
Addended by: Ricka Burdock on: 01/24/2016 04:42 PM   Modules accepted: Orders

## 2016-01-24 NOTE — Addendum Note (Signed)
Addended by: Ricka Burdock on: 01/24/2016 04:29 PM   Modules accepted: Orders

## 2016-01-25 LAB — WET PREP BY MOLECULAR PROBE
Candida species: NEGATIVE
Gardnerella vaginalis: NEGATIVE
Trichomonas vaginosis: NEGATIVE

## 2016-01-27 ENCOUNTER — Telehealth: Payer: Self-pay | Admitting: *Deleted

## 2016-01-27 DIAGNOSIS — N760 Acute vaginitis: Principal | ICD-10-CM

## 2016-01-27 DIAGNOSIS — B9689 Other specified bacterial agents as the cause of diseases classified elsewhere: Secondary | ICD-10-CM

## 2016-01-27 LAB — CBC

## 2016-01-27 LAB — CYTOLOGY - PAP
Chlamydia: NEGATIVE
HPV: NOT DETECTED
Neisseria Gonorrhea: NEGATIVE

## 2016-01-27 MED ORDER — METRONIDAZOLE 0.75 % VA GEL: 1.0000 | Freq: Every day | VAGINAL | 0 refills | Status: DC

## 2016-01-27 NOTE — Telephone Encounter (Signed)
Pt is taking Flagyl for BV, c/o severe nausea, sent Metrogel to pharmacy for pt to try instead.

## 2016-01-30 ENCOUNTER — Other Ambulatory Visit: Payer: BC Managed Care – PPO

## 2016-01-31 ENCOUNTER — Encounter: Payer: Self-pay | Admitting: *Deleted

## 2016-04-23 ENCOUNTER — Telehealth: Payer: Self-pay | Admitting: *Deleted

## 2016-04-23 DIAGNOSIS — N760 Acute vaginitis: Secondary | ICD-10-CM

## 2016-04-23 DIAGNOSIS — N898 Other specified noninflammatory disorders of vagina: Secondary | ICD-10-CM

## 2016-04-23 DIAGNOSIS — B9689 Other specified bacterial agents as the cause of diseases classified elsewhere: Secondary | ICD-10-CM

## 2016-04-23 MED ORDER — METRONIDAZOLE 0.75 % VA GEL: 1.0000 | Freq: Every day | VAGINAL | 0 refills | Status: DC

## 2016-04-23 MED ORDER — FLUCONAZOLE 150 MG PO TABS: 150.0000 mg | ORAL_TABLET | Freq: Once | ORAL | 0 refills | Status: AC

## 2016-04-23 NOTE — Telephone Encounter (Signed)
Pt called stating she is experiencing a vaginal discharge with a foul smelling odor as well as vaginal irritation.  Has a hx of BV in the past and feels symptoms are persistent with BV.  Sent Metrogel and one time dose Diflucan to the pharmacy.  Instructed pt on medication use and if symptoms persist after treatment to schedule appt for evaluation.

## 2016-04-23 NOTE — Telephone Encounter (Signed)
-----   Message from Blanchie Dessert, [place] sent at 04/23/2016 11:59 AM EST ----- Regarding: Rx refill  Pt wants Rx for yeast infection Uses [facility] [address]

## 2016-05-21 ENCOUNTER — Encounter: Payer: Self-pay | Admitting: Obstetrics & Gynecology

## 2016-05-21 ENCOUNTER — Ambulatory Visit (INDEPENDENT_AMBULATORY_CARE_PROVIDER_SITE_OTHER): Payer: BC Managed Care – PPO | Admitting: Obstetrics & Gynecology

## 2016-05-21 VITALS — BP 132/84 | HR 82 | Ht 63.0 in | Wt 294.0 lb

## 2016-05-21 DIAGNOSIS — D259 Leiomyoma of uterus, unspecified: Secondary | ICD-10-CM | POA: Diagnosis not present

## 2016-05-21 DIAGNOSIS — N920 Excessive and frequent menstruation with regular cycle: Secondary | ICD-10-CM

## 2016-05-21 MED ORDER — CLINDAMYCIN PHOSPHATE 1 % EX GEL: Freq: Two times a day (BID) | CUTANEOUS | 11 refills | Status: DC

## 2016-05-21 NOTE — Progress Notes (Signed)
   Subjective:    Patient ID: Daisy Baker, female    DOB: February 09, 1976, 41 y.o.   MRN: [id number]  HPI  41 yo SAA P2 here with the continued complaint of heavy monthly bleeding, lasting 3-4 days per month. She tried Nexplanon, OCPs with no help. She wears pads and tampons at the same time. She sometimes has to change clothes at work due to bleeding. I did a laparoscopic BS and noted extremely dense pelvic adhesions. I did extensive lysis of adhesions. I then attempted a Novasure ablation after a d&c but the procedure could not proceed due to failure of the device to pass its test.  She is here today to schedule a TAH.   Review of Systems     Objective:   Physical Exam  WNWHBFNAD Breathing, conversing, and ambulating normally NO descensus of uterus with traction.      Assessment & Plan:  DUB, unresponsive to medical/lesser surgical treatments. Will plan for TAH.  She understands the risks of surgery, including, but not to infection, bleeding, DVTs, damage to bowel, bladder, ureters. She wishes to proceed.

## 2016-05-21 NOTE — Addendum Note (Signed)
Addended by: Emily Filbert on: 05/21/2016 04:26 PM   Modules accepted: Orders

## 2016-06-05 ENCOUNTER — Telehealth: Payer: Self-pay | Admitting: *Deleted

## 2016-06-05 DIAGNOSIS — N920 Excessive and frequent menstruation with regular cycle: Secondary | ICD-10-CM

## 2016-06-05 MED ORDER — TRAMADOL HCL 50 MG PO TABS: 50.0000 mg | ORAL_TABLET | Freq: Four times a day (QID) | ORAL | 1 refills | Status: DC | PRN

## 2016-06-05 NOTE — Telephone Encounter (Signed)
-----   Message from Blanchie Dessert, [place] sent at 06/05/2016  9:08 AM EST ----- Regarding: Rx Refill Contact: [phone] Pt called and would like a refill for her Tramadol sent to the [facility] off [address]

## 2016-06-05 NOTE — Telephone Encounter (Signed)
Phoned rx refill for Toradol to the pharmacy per Dr Hulan Fray order, pt notified.

## 2016-06-27 ENCOUNTER — Telehealth: Payer: Self-pay | Admitting: *Deleted

## 2016-06-27 DIAGNOSIS — B373 Candidiasis of vulva and vagina: Secondary | ICD-10-CM

## 2016-06-27 DIAGNOSIS — B3731 Acute candidiasis of vulva and vagina: Secondary | ICD-10-CM

## 2016-06-27 MED ORDER — FLUCONAZOLE 150 MG PO TABS: 150.0000 mg | ORAL_TABLET | Freq: Once | ORAL | 0 refills | Status: AC

## 2016-06-27 NOTE — Telephone Encounter (Signed)
-----   Message from Blanchie Dessert, [place] sent at 06/27/2016  8:52 AM EDT ----- Regarding: rx request Contact: [phone] Please call in some Diflucan to [facility] off [address], pt has been on antibiotic and has a burning feeling. She has one dose already but wanted a second

## 2016-06-27 NOTE — Telephone Encounter (Signed)
Diflucan sent to the pharmacy

## 2016-06-28 ENCOUNTER — Ambulatory Visit (INDEPENDENT_AMBULATORY_CARE_PROVIDER_SITE_OTHER): Payer: BC Managed Care – PPO | Admitting: Student

## 2016-06-28 ENCOUNTER — Other Ambulatory Visit (HOSPITAL_COMMUNITY)
Admission: RE | Admit: 2016-06-28 | Discharge: 2016-06-28 | Disposition: A | Discharge status: Home / Self Care | Payer: BC Managed Care – PPO | Source: Ambulatory Visit | Attending: Student | Admitting: Student

## 2016-06-28 DIAGNOSIS — N898 Other specified noninflammatory disorders of vagina: Secondary | ICD-10-CM | POA: Diagnosis not present

## 2016-06-28 DIAGNOSIS — B9689 Other specified bacterial agents as the cause of diseases classified elsewhere: Secondary | ICD-10-CM

## 2016-06-28 DIAGNOSIS — Z113 Encounter for screening for infections with a predominantly sexual mode of transmission: Secondary | ICD-10-CM | POA: Diagnosis not present

## 2016-06-28 DIAGNOSIS — B373 Candidiasis of vulva and vagina: Secondary | ICD-10-CM

## 2016-06-28 DIAGNOSIS — N760 Acute vaginitis: Secondary | ICD-10-CM | POA: Insufficient documentation

## 2016-06-28 DIAGNOSIS — B3731 Acute candidiasis of vulva and vagina: Secondary | ICD-10-CM

## 2016-06-28 MED ORDER — LIDOCAINE 5 % EX OINT: 1.0000 "application " | TOPICAL_OINTMENT | CUTANEOUS | 0 refills | Status: DC | PRN

## 2016-06-28 MED ORDER — FLUCONAZOLE 150 MG PO TABS: 150.0000 mg | ORAL_TABLET | Freq: Every day | ORAL | 0 refills | Status: DC

## 2016-06-28 NOTE — Progress Notes (Signed)
Subjective:     Patient ID: Daisy Baker, female   DOB: 23-Dec-1975, 41 y.o.   MRN: [id number] Patient Daisy Baker is a 41 year old non-pregnant female here with complaints of vaginal burning/itching and discharge. She has been treated in the past for chronic BV and yeast. She Baker recently started a course of Diflucan; she took one pill (150 mg) yesterday.  Vaginal Itching  The patient's primary symptoms include genital itching and vaginal discharge. The patient's pertinent negatives include no genital lesions, genital odor, genital rash, missed menses, pelvic pain or vaginal bleeding. This is a chronic problem. The current episode started in the past 7 days. The problem occurs constantly. The problem has been gradually worsening. She is not pregnant. Pertinent negatives include no abdominal pain, back pain, discolored urine, dysuria, flank pain, frequency or urgency. The vaginal discharge was clear and white. There has been no bleeding.     Review of Systems  Gastrointestinal: Negative for abdominal pain.  Genitourinary: Positive for vaginal discharge. Negative for dysuria, flank pain, frequency, missed menses, pelvic pain and urgency.  Musculoskeletal: Negative for back pain.  Neurological: Negative.        Objective:   Physical Exam  Constitutional: She is oriented to person, place, and time. She appears well-developed and well-nourished.  HENT:  Head: Normocephalic.  Eyes: Pupils are equal, round, and reactive to light.  Neck: Normal range of motion.  Abdominal: Soft.  Genitourinary:  Genitourinary Comments: NEFG, no lesions on vaginal walls. Small amoutn of clumpy yeast in the vagina, vaginal walls are erythematous. Vaginal discharge non-odorous. Cervix is pink with no lesions.   Neurological: She is alert and oriented to person, place, and time.  Skin: Skin is warm and dry.       Assessment:     1. Yeast infection involving the vagina and surrounding area   2.  Bacterial vaginosis   3. Vaginal irritation        Plan:        1. RX for Diflucan 150 mg x 2 sent to pharmacy 2. GC and CT cultures and wet prep pending. Patient knows that we will call her if results require medication 3. Encouraged patient to try comfort measures (RX for lidocaine jelly sent) 4. Patient will follow up PRN  Maye Hides CNM

## 2016-07-02 ENCOUNTER — Encounter (HOSPITAL_COMMUNITY): Payer: Self-pay

## 2016-07-02 LAB — CERVICOVAGINAL ANCILLARY ONLY
Bacterial vaginitis: NEGATIVE
Candida vaginitis: POSITIVE — AB
Chlamydia: NEGATIVE
Neisseria Gonorrhea: NEGATIVE
Trichomonas: NEGATIVE

## 2016-07-05 ENCOUNTER — Encounter: Payer: Self-pay | Admitting: Obstetrics & Gynecology

## 2016-07-05 ENCOUNTER — Ambulatory Visit (INDEPENDENT_AMBULATORY_CARE_PROVIDER_SITE_OTHER): Payer: BC Managed Care – PPO | Admitting: Obstetrics & Gynecology

## 2016-07-05 VITALS — BP 137/84 | HR 83 | Resp 18 | Wt 294.0 lb

## 2016-07-05 DIAGNOSIS — Z01812 Encounter for preprocedural laboratory examination: Secondary | ICD-10-CM

## 2016-07-05 DIAGNOSIS — Z3202 Encounter for pregnancy test, result negative: Secondary | ICD-10-CM | POA: Diagnosis not present

## 2016-07-05 DIAGNOSIS — R87612 Low grade squamous intraepithelial lesion on cytologic smear of cervix (LGSIL): Secondary | ICD-10-CM | POA: Diagnosis not present

## 2016-07-05 LAB — POCT URINE PREGNANCY: Preg Test, Ur: NEGATIVE

## 2016-07-05 NOTE — Progress Notes (Signed)
   Subjective:    Patient ID: Daisy Baker, female    DOB: [birth year], 41 y.o.   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 She is scheduled for a hysterectomy 09-20-16.   Review of Systems     Objective:   Physical Exam Morbidly obese pleasant BFNAD UPT negative, consent signed, time out done Cervix prepped with acetic acid. Transformation zone seen in its entirety. Colpo adequate. ECC obtained. She tolerated the procedure well.      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

## 2016-08-29 ENCOUNTER — Telehealth: Payer: Self-pay | Admitting: *Deleted

## 2016-08-29 DIAGNOSIS — B9689 Other specified bacterial agents as the cause of diseases classified elsewhere: Secondary | ICD-10-CM

## 2016-08-29 DIAGNOSIS — B373 Candidiasis of vulva and vagina: Secondary | ICD-10-CM

## 2016-08-29 DIAGNOSIS — B3731 Acute candidiasis of vulva and vagina: Secondary | ICD-10-CM

## 2016-08-29 DIAGNOSIS — N760 Acute vaginitis: Principal | ICD-10-CM

## 2016-08-29 MED ORDER — METRONIDAZOLE 0.75 % VA GEL: 1.0000 | Freq: Every day | VAGINAL | 1 refills | Status: DC

## 2016-08-29 MED ORDER — FLUCONAZOLE 150 MG PO TABS
150.0000 mg | ORAL_TABLET | ORAL | 3 refills | Status: DC
Start: 2016-08-29 — End: 2016-09-22

## 2016-08-29 NOTE — Telephone Encounter (Signed)
Pt called in stating she has vaginal itching, discharge and odor. She states she would like metrogel and diflucan. Explained to pt that she would need to be seen if Sx persist after treartment as she may need extended metrogel treatment.

## 2016-09-06 NOTE — Patient Instructions (Signed)
Your procedure is scheduled on:  Thursday, September 20, 2016  Enter through the Main Entrance of [facility] at:  6:00 AM  Pick up the phone at the desk and dial [phone].  Call this number if you have problems the morning of surgery: [phone].  Remember: Do NOT eat food or drink after:  Midnight Wednesday  Take these medicines the morning of surgery with a SIP OF WATER:  None  Bring Asthma Inhaler day of surgery  Stop ALL herbal medications at this time  Do NOT smoke the day of surgery.  Do NOT wear jewelry (body piercing), metal hair clips/bobby pins, make-up, or nail polish. Do NOT wear lotions, powders, or perfumes.  You may wear deodorant. Do NOT shave for 48 hours prior to surgery. Do NOT bring valuables to the hospital. Contacts, dentures, or bridgework may not be worn into surgery.  Leave suitcase in car.  After surgery it may be brought to your room.  For patients admitted to the hospital, checkout time is 11:00 AM the day of discharge.  Bring a copy of your healthcare power of attorney and living will documents.

## 2016-09-10 ENCOUNTER — Encounter (HOSPITAL_COMMUNITY): Payer: Self-pay

## 2016-09-10 ENCOUNTER — Encounter (HOSPITAL_COMMUNITY)
Admission: RE | Admit: 2016-09-10 | Discharge: 2016-09-10 | Disposition: A | Discharge status: Home / Self Care | Payer: BC Managed Care – PPO | Source: Ambulatory Visit | Attending: Obstetrics & Gynecology | Admitting: Obstetrics & Gynecology

## 2016-09-10 DIAGNOSIS — Z01812 Encounter for preprocedural laboratory examination: Secondary | ICD-10-CM | POA: Insufficient documentation

## 2016-09-10 HISTORY — DX: Anemia, unspecified: D64.9

## 2016-09-10 HISTORY — DX: Nausea with vomiting, unspecified: R11.2

## 2016-09-10 HISTORY — DX: Other specified postprocedural states: Z98.890

## 2016-09-10 LAB — TYPE AND SCREEN
ABO/RH(D): O POS
Antibody Screen: NEGATIVE

## 2016-09-10 LAB — BASIC METABOLIC PANEL
Anion gap: 7 (ref 5–15)
BUN: 15 mg/dL (ref 6–20)
CO2: 25 mmol/L (ref 22–32)
Calcium: 9.1 mg/dL (ref 8.9–10.3)
Chloride: 104 mmol/L (ref 101–111)
Creatinine, Ser: 0.8 mg/dL (ref 0.44–1.00)
GFR calc Af Amer: >60 mL/min (ref >60)
GFR calc non Af Amer: >60 mL/min (ref >60)
Glucose, Bld: 111 mg/dL — ABNORMAL HIGH (ref 65–99)
Potassium: 3.7 mmol/L (ref 3.5–5.1)
Sodium: 136 mmol/L (ref 135–145)

## 2016-09-10 LAB — CBC
HCT: 35.3 % — ABNORMAL LOW (ref 36.0–46.0)
Hemoglobin: 11.5 g/dL — ABNORMAL LOW (ref 12.0–15.0)
MCH: 28 pg (ref 26.0–34.0)
MCHC: 32.6 g/dL (ref 30.0–36.0)
MCV: 85.9 fL (ref 78.0–100.0)
Platelets: 296 10*3/uL (ref 150–400)
RBC: 4.11 MIL/uL (ref 3.87–5.11)
RDW: 14.4 % (ref 11.5–15.5)
WBC: 5.5 10*3/uL (ref 4.0–10.5)

## 2016-09-10 LAB — ABO/RH: ABO/RH(D): O POS

## 2016-09-20 ENCOUNTER — Inpatient Hospital Stay (HOSPITAL_COMMUNITY): Payer: BC Managed Care – PPO | Admitting: Anesthesiology

## 2016-09-20 ENCOUNTER — Inpatient Hospital Stay (HOSPITAL_COMMUNITY)
Admission: RE | Admit: 2016-09-20 | Discharge: 2016-09-22 | DRG: 742 | Disposition: A | Discharge status: Home / Self Care | Payer: BC Managed Care – PPO | Source: Ambulatory Visit | Attending: Obstetrics & Gynecology | Admitting: Obstetrics & Gynecology

## 2016-09-20 ENCOUNTER — Encounter (HOSPITAL_COMMUNITY)
Admission: RE | Disposition: A | Discharge status: Home / Self Care | Payer: Self-pay | Source: Ambulatory Visit | Attending: Obstetrics & Gynecology

## 2016-09-20 ENCOUNTER — Encounter (HOSPITAL_COMMUNITY): Payer: Self-pay

## 2016-09-20 ENCOUNTER — Encounter (HOSPITAL_COMMUNITY): Payer: Self-pay | Admitting: *Deleted

## 2016-09-20 DIAGNOSIS — D259 Leiomyoma of uterus, unspecified: Secondary | ICD-10-CM | POA: Diagnosis present

## 2016-09-20 DIAGNOSIS — Z6841 Body Mass Index (BMI) 40.0 and over, adult: Secondary | ICD-10-CM | POA: Diagnosis not present

## 2016-09-20 DIAGNOSIS — N870 Mild cervical dysplasia: Secondary | ICD-10-CM | POA: Diagnosis not present

## 2016-09-20 DIAGNOSIS — Z9889 Other specified postprocedural states: Secondary | ICD-10-CM

## 2016-09-20 DIAGNOSIS — N938 Other specified abnormal uterine and vaginal bleeding: Secondary | ICD-10-CM | POA: Diagnosis present

## 2016-09-20 HISTORY — PX: ABDOMINAL HYSTERECTOMY: SHX81

## 2016-09-20 SURGERY — HYSTERECTOMY, ABDOMINAL
Anesthesia: General | Site: Abdomen

## 2016-09-20 MED ORDER — PHENYLEPHRINE HCL 10 MG/ML IJ SOLN
INTRAMUSCULAR | Status: DC | PRN
  Administered 2016-09-20 (×5): 80 ug via INTRAVENOUS

## 2016-09-20 MED ORDER — ONDANSETRON HCL 4 MG/2ML IJ SOLN
4.0000 mg | Freq: Four times a day (QID) | INTRAMUSCULAR | Status: DC | PRN
  Administered 2016-09-20 (×2): 4 mg via INTRAVENOUS
  Filled 2016-09-20 (×2): qty 2

## 2016-09-20 MED ORDER — ONDANSETRON HCL 4 MG/2ML IJ SOLN
INTRAMUSCULAR | Status: AC
  Filled 2016-09-20: qty 2

## 2016-09-20 MED ORDER — DEXAMETHASONE SODIUM PHOSPHATE 10 MG/ML IJ SOLN
INTRAMUSCULAR | Status: DC | PRN
  Administered 2016-09-20: 4 mg via INTRAVENOUS

## 2016-09-20 MED ORDER — LACTATED RINGERS IV SOLN: INTRAVENOUS | Status: DC

## 2016-09-20 MED ORDER — BUPIVACAINE HCL (PF) 0.5 % IJ SOLN
INTRAMUSCULAR | Status: DC | PRN
  Administered 2016-09-20: 30 mL

## 2016-09-20 MED ORDER — MIDAZOLAM HCL 2 MG/2ML IJ SOLN
INTRAMUSCULAR | Status: AC
  Filled 2016-09-20: qty 2

## 2016-09-20 MED ORDER — LIDOCAINE HCL (CARDIAC) 20 MG/ML IV SOLN
INTRAVENOUS | Status: DC | PRN
  Administered 2016-09-20: 60 mg via INTRAVENOUS

## 2016-09-20 MED ORDER — LACTATED RINGERS IV SOLN
INTRAVENOUS | Status: DC
  Administered 2016-09-20 (×2): via INTRAVENOUS

## 2016-09-20 MED ORDER — FENTANYL CITRATE (PF) 100 MCG/2ML IJ SOLN
INTRAMUSCULAR | Status: AC
  Filled 2016-09-20: qty 2

## 2016-09-20 MED ORDER — MIDAZOLAM HCL 2 MG/2ML IJ SOLN
INTRAMUSCULAR | Status: DC | PRN
  Administered 2016-09-20: 2 mg via INTRAVENOUS

## 2016-09-20 MED ORDER — PROPOFOL 10 MG/ML IV BOLUS
INTRAVENOUS | Status: AC
  Filled 2016-09-20: qty 40

## 2016-09-20 MED ORDER — OXYCODONE HCL 5 MG/5ML PO SOLN: 5.0000 mg | Freq: Once | ORAL | Status: DC | PRN

## 2016-09-20 MED ORDER — ROCURONIUM BROMIDE 100 MG/10ML IV SOLN
INTRAVENOUS | Status: DC | PRN
  Administered 2016-09-20: 50 mg via INTRAVENOUS
  Administered 2016-09-20 (×2): 20 mg via INTRAVENOUS

## 2016-09-20 MED ORDER — SCOPOLAMINE 1 MG/3DAYS TD PT72
1.0000 | MEDICATED_PATCH | Freq: Once | TRANSDERMAL | Status: DC
  Administered 2016-09-20: 1.5 mg via TRANSDERMAL

## 2016-09-20 MED ORDER — ONDANSETRON HCL 4 MG PO TABS: 4.0000 mg | ORAL_TABLET | Freq: Four times a day (QID) | ORAL | Status: DC | PRN

## 2016-09-20 MED ORDER — BUPIVACAINE HCL (PF) 0.5 % IJ SOLN
INTRAMUSCULAR | Status: AC
  Filled 2016-09-20: qty 30

## 2016-09-20 MED ORDER — SCOPOLAMINE 1 MG/3DAYS TD PT72
MEDICATED_PATCH | TRANSDERMAL | Status: AC
  Administered 2016-09-20: 1.5 mg via TRANSDERMAL
  Filled 2016-09-20: qty 1

## 2016-09-20 MED ORDER — SUGAMMADEX SODIUM 200 MG/2ML IV SOLN
INTRAVENOUS | Status: DC | PRN
  Administered 2016-09-20: 500 mg via INTRAVENOUS

## 2016-09-20 MED ORDER — ROCURONIUM BROMIDE 100 MG/10ML IV SOLN
INTRAVENOUS | Status: AC
  Filled 2016-09-20: qty 1

## 2016-09-20 MED ORDER — ONDANSETRON HCL 4 MG/2ML IJ SOLN
INTRAMUSCULAR | Status: DC | PRN
  Administered 2016-09-20: 4 mg via INTRAVENOUS

## 2016-09-20 MED ORDER — OXYCODONE-ACETAMINOPHEN 5-325 MG PO TABS
1.0000 | ORAL_TABLET | ORAL | Status: DC | PRN
  Filled 2016-09-20: qty 1

## 2016-09-20 MED ORDER — SUGAMMADEX SODIUM 500 MG/5ML IV SOLN
INTRAVENOUS | Status: AC
  Filled 2016-09-20: qty 5

## 2016-09-20 MED ORDER — DEXTROSE 5 % IV SOLN
3.0000 g | Freq: Once | INTRAVENOUS | Status: AC
  Administered 2016-09-20: 3 g via INTRAVENOUS
  Filled 2016-09-20: qty 3000

## 2016-09-20 MED ORDER — PROPOFOL 10 MG/ML IV BOLUS
INTRAVENOUS | Status: DC | PRN
  Administered 2016-09-20: 300 mg via INTRAVENOUS

## 2016-09-20 MED ORDER — LACTATED RINGERS IV SOLN
INTRAVENOUS | Status: DC
  Administered 2016-09-20 (×5): via INTRAVENOUS

## 2016-09-20 MED ORDER — FENTANYL CITRATE (PF) 100 MCG/2ML IJ SOLN
INTRAMUSCULAR | Status: AC
  Administered 2016-09-20: 50 ug via INTRAVENOUS
  Filled 2016-09-20: qty 2

## 2016-09-20 MED ORDER — CEFAZOLIN SODIUM-DEXTROSE 2-4 GM/100ML-% IV SOLN: 2.0000 g | INTRAVENOUS | Status: DC

## 2016-09-20 MED ORDER — FENTANYL CITRATE (PF) 100 MCG/2ML IJ SOLN
25.0000 ug | INTRAMUSCULAR | Status: DC | PRN
  Administered 2016-09-20 (×3): 50 ug via INTRAVENOUS

## 2016-09-20 MED ORDER — PHENYLEPHRINE 40 MCG/ML (10ML) SYRINGE FOR IV PUSH (FOR BLOOD PRESSURE SUPPORT)
PREFILLED_SYRINGE | INTRAVENOUS | Status: AC
  Filled 2016-09-20: qty 10

## 2016-09-20 MED ORDER — FENTANYL CITRATE (PF) 250 MCG/5ML IJ SOLN
INTRAMUSCULAR | Status: AC
  Filled 2016-09-20: qty 5

## 2016-09-20 MED ORDER — OXYCODONE HCL 5 MG PO TABS: 5.0000 mg | ORAL_TABLET | Freq: Once | ORAL | Status: DC | PRN

## 2016-09-20 MED ORDER — IBUPROFEN 800 MG PO TABS
800.0000 mg | ORAL_TABLET | Freq: Three times a day (TID) | ORAL | Status: DC | PRN
  Administered 2016-09-21 – 2016-09-22 (×2): 800 mg via ORAL
  Filled 2016-09-20 (×2): qty 1

## 2016-09-20 MED ORDER — DEXAMETHASONE SODIUM PHOSPHATE 4 MG/ML IJ SOLN
INTRAMUSCULAR | Status: AC
  Filled 2016-09-20: qty 1

## 2016-09-20 MED ORDER — HYDROMORPHONE HCL 1 MG/ML IJ SOLN
0.2000 mg | INTRAMUSCULAR | Status: DC | PRN
  Administered 2016-09-20 (×4): 0.6 mg via INTRAVENOUS
  Filled 2016-09-20 (×4): qty 1

## 2016-09-20 MED ORDER — MEPERIDINE HCL 25 MG/ML IJ SOLN: 6.2500 mg | INTRAMUSCULAR | Status: DC | PRN

## 2016-09-20 MED ORDER — PROMETHAZINE HCL 25 MG/ML IJ SOLN: 6.2500 mg | INTRAMUSCULAR | Status: DC | PRN

## 2016-09-20 MED ORDER — FENTANYL CITRATE (PF) 100 MCG/2ML IJ SOLN
INTRAMUSCULAR | Status: DC | PRN
  Administered 2016-09-20: 100 ug via INTRAVENOUS
  Administered 2016-09-20: 150 ug via INTRAVENOUS

## 2016-09-20 MED ORDER — LIDOCAINE HCL (CARDIAC) 20 MG/ML IV SOLN
INTRAVENOUS | Status: AC
  Filled 2016-09-20: qty 5

## 2016-09-20 SURGICAL SUPPLY — 32 items
CANISTER SUCT 3000ML PPV (MISCELLANEOUS) ×3 IMPLANT
CLOSURE WOUND 1/2 X4 (GAUZE/BANDAGES/DRESSINGS) ×1
CLOTH BEACON ORANGE TIMEOUT ST (SAFETY) ×3 IMPLANT
CONT PATH 16OZ SNAP LID 3702 (MISCELLANEOUS) ×3 IMPLANT
DECANTER SPIKE VIAL GLASS SM (MISCELLANEOUS) ×3 IMPLANT
DRAPE CESAREAN BIRTH W POUCH (DRAPES) ×3 IMPLANT
DRAPE WARM FLUID 44X44 (DRAPE) ×3 IMPLANT
DRSG OPSITE POSTOP 4X10 (GAUZE/BANDAGES/DRESSINGS) ×3 IMPLANT
DURAPREP 26ML APPLICATOR (WOUND CARE) ×3 IMPLANT
GAUZE SPONGE 4X4 16PLY XRAY LF (GAUZE/BANDAGES/DRESSINGS) ×3 IMPLANT
GLOVE BIO SURGEON STRL SZ 6.5 (GLOVE) ×2 IMPLANT
GLOVE BIO SURGEONS STRL SZ 6.5 (GLOVE) ×1
GLOVE BIOGEL PI IND STRL 7.0 (GLOVE) ×2 IMPLANT
GLOVE BIOGEL PI INDICATOR 7.0 (GLOVE) ×4
GOWN STRL REUS W/TWL LRG LVL3 (GOWN DISPOSABLE) ×9 IMPLANT
HEMOSTAT ARISTA ABSORB 3G PWDR (MISCELLANEOUS) IMPLANT
NEEDLE SPNL 18GX3.5 QUINCKE PK (NEEDLE) ×3 IMPLANT
NS IRRIG 1000ML POUR BTL (IV SOLUTION) ×3 IMPLANT
PACK ABDOMINAL GYN (CUSTOM PROCEDURE TRAY) ×3 IMPLANT
PAD OB MATERNITY 4.3X12.25 (PERSONAL CARE ITEMS) ×3 IMPLANT
PROTECTOR NERVE ULNAR (MISCELLANEOUS) ×3 IMPLANT
SPONGE LAP 18X18 X RAY DECT (DISPOSABLE) ×3 IMPLANT
STRIP CLOSURE SKIN 1/2X4 (GAUZE/BANDAGES/DRESSINGS) ×2 IMPLANT
SUT CHROMIC 3 0 SH 27 (SUTURE) IMPLANT
SUT PDS AB 0 CTX 60 (SUTURE) ×3 IMPLANT
SUT VIC AB 0 CT1 36 (SUTURE) ×6 IMPLANT
SUT VIC AB 2-0 CT1 18 (SUTURE) ×9 IMPLANT
SUT VIC AB 3-0 CT1 27 (SUTURE) ×2
SUT VIC AB 3-0 CT1 TAPERPNT 27 (SUTURE) ×1 IMPLANT
SYR 30ML LL (SYRINGE) ×3 IMPLANT
TOWEL OR 17X24 6PK STRL BLUE (TOWEL DISPOSABLE) ×6 IMPLANT
TRAY FOLEY CATH SILVER 14FR (SET/KITS/TRAYS/PACK) ×3 IMPLANT

## 2016-09-20 NOTE — Anesthesia Procedure Notes (Signed)
Procedure Name: Intubation Performed by: Jonna Munro Pre-anesthesia Checklist: Patient identified, Emergency Drugs available, Suction available, Patient being monitored and Timeout performed Patient Re-evaluated:Patient Re-evaluated prior to inductionOxygen Delivery Method: Circle system utilized Preoxygenation: Pre-oxygenation with 100% oxygen Intubation Type: IV induction Laryngoscope Size: Glidescope and 3 Grade View: Grade I Tube type: Oral Tube size: 7.0 mm Number of attempts: 1 Airway Equipment and Method: Video-laryngoscopy and Stylet Placement Confirmation: ETT inserted through vocal cords under direct vision,  positive ETCO2 and breath sounds checked- equal and bilateral Secured at: 22 cm Tube secured with: Tape Dental Injury: Teeth and Oropharynx as per pre-operative assessment  Comments: glidescope used due to patient habitus and large tongue.

## 2016-09-20 NOTE — Anesthesia Preprocedure Evaluation (Signed)
Anesthesia Evaluation  Patient identified by MRN, date of birth, ID band Patient awake    Reviewed: Allergy & Precautions, NPO status , Patient's Chart, lab work & pertinent test results  History of Anesthesia Complications (+) PONV and history of anesthetic complications  Airway Mallampati: IV  TM Distance: >3 FB Neck ROM: Full    Dental  (+) Teeth Intact, Dental Advisory Given   Pulmonary asthma , sleep apnea ,    breath sounds clear to auscultation       Cardiovascular negative cardio ROS   Rhythm:Regular Rate:Normal     Neuro/Psych  Headaches, negative psych ROS   GI/Hepatic negative GI ROS, Neg liver ROS,   Endo/Other  negative endocrine ROS  Renal/GU negative Renal ROS  negative genitourinary   Musculoskeletal negative musculoskeletal ROS (+)   Abdominal   Peds negative pediatric ROS (+)  Hematology negative hematology ROS (+)   Anesthesia Other Findings   Reproductive/Obstetrics negative OB ROS                            Anesthesia Physical Anesthesia Plan  ASA: III  Anesthesia Plan: General   Post-op Pain Management:    Induction: Intravenous  PONV Risk Score and Plan: 4 or greater and Ondansetron, Dexamethasone, Propofol, Midazolam and Scopolamine patch - Pre-op  Airway Management Planned: Oral ETT and Video Laryngoscope Planned  Additional Equipment:   Intra-op Plan:   Post-operative Plan: Extubation in OR  Informed Consent: I have reviewed the patients History and Physical, chart, labs and discussed the procedure including the risks, benefits and alternatives for the proposed anesthesia with the patient or authorized representative who has indicated his/her understanding and acceptance.     Plan Discussed with: CRNA  Anesthesia Plan Comments:        Anesthesia Quick Evaluation

## 2016-09-20 NOTE — H&P (Signed)
Daisy Baker is an 41 yo SAA P2 here with the continued complaint of heavy monthly bleeding, lasting 3-4 days per month. She tried Nexplanon, OCPs with no help. She wears pads and tampons at the same time. She sometimes has to change clothes at work due to bleeding. I did a laparoscopic BS and noted extremely dense pelvic adhesions. I did extensive lysis of adhesions. I then attempted a Novasure ablation after a d&c but the procedure could not proceed due to failure of the device to pass its test.     Menstrual History: Menarche age: 27 Patient's last menstrual period was 09/09/2016 (exact date).    Past Medical History:  Diagnosis Date  . Allergy   . Anemia   . Asthma   . Headache    Migraines  . PONV (postoperative nausea and vomiting)   . Sleep apnea    does not use CPAP - just picked it up  . SVD (spontaneous vaginal delivery)    x 1  . Vertigo     Past Surgical History:  Procedure Laterality Date  . CESAREAN SECTION    . DILITATION & CURRETTAGE/HYSTROSCOPY WITH NOVASURE ABLATION N/A 09/02/2015   Procedure: DILATATION & CURETTAGE WITH ATTEMPTED NOVASURE ABLATION;  Surgeon: Emily Filbert, MD;  Location: [facility];  Service: Gynecology;  Laterality: N/A;  . LAPAROSCOPIC TUBAL LIGATION Bilateral 09/02/2015   Procedure: LAPAROSCOPIC BILATERAL SALPINGECTOMY, LYSIS OF ADHESIONS;  Surgeon: Emily Filbert, MD;  Location: [facility];  Service: Gynecology;  Laterality: Bilateral;  . MYOMECTOMY  2011  . NORPLANT REMOVAL Left 09/02/2015   Procedure: REMOVAL OF NORPLANT;  Surgeon: Emily Filbert, MD;  Location: [facility];  Service: Gynecology;  Laterality: Left;    Family History  Problem Relation Age of Onset  . Hypertension Mother   . Diabetes Mother   . Asthma Mother   . Hypertension Father   . Diabetes Father     Social History:  reports that she has never smoked. She has never used smokeless tobacco. She reports that she drinks alcohol. She reports that she does not use drugs.  Allergies: No  Known Allergies  Prescriptions Prior to Admission  Medication Sig Dispense Refill Last Dose  . acetaminophen (TYLENOL) 500 MG tablet Take 500-1,000 mg by mouth every 6 (six) hours as needed (for pain.).    Past Week at Unknown time  . albuterol (PROVENTIL HFA;VENTOLIN HFA) 108 (90 Base) MCG/ACT inhaler Inhale 2 puffs into the lungs every 6 (six) hours as needed for wheezing or shortness of breath.    Taking  . aspirin-acetaminophen-caffeine (EXCEDRIN MIGRAINE) 250-250-65 MG tablet Take 2 tablets by mouth every 6 (six) hours as needed (for migraine headache.).   Past Week at Unknown time  . dicyclomine (BENTYL) 10 MG capsule Take 10 mg by mouth 4 (four) times daily as needed (for abdominal spasms/ibs).    09/19/2016 at Unknown time  . fexofenadine (ALLEGRA) 180 MG tablet Take 180 mg by mouth daily.    09/19/2016 at Unknown time  . fexofenadine-pseudoephedrine (ALLEGRA-D) 60-120 MG per tablet Take 1 tablet by mouth every 12 (twelve) hours. (Patient taking differently: Take 1 tablet by mouth 2 (two) times daily as needed (for sinus issues/allergies.). ) 30 tablet 0 Past Month at Unknown time  . fluconazole (DIFLUCAN) 150 MG tablet Take 1 tablet (150 mg total) by mouth every 3 (three) days. For three doses 3 tablet 3 09/07/2016  . fluticasone (FLONASE) 50 MCG/ACT nasal spray Place 1 spray into the nose daily  as needed (for allergies.).    Past Month at Unknown time  . ibuprofen (ADVIL,MOTRIN) 600 MG tablet Take 1 tablet (600 mg total) by mouth every 6 (six) hours as needed. (Patient taking differently: Take 600 mg by mouth every 6 (six) hours as needed (for pain.). ) 30 tablet 1 Past Month at Unknown time  . traMADol (ULTRAM) 50 MG tablet Take 1 tablet (50 mg total) by mouth every 6 (six) hours as needed. (Patient taking differently: Take 50 mg by mouth every 6 (six) hours as needed (for pain.). ) 30 tablet 1 Past Month at Unknown time  . clindamycin (CLINDAGEL) 1 % gel Apply topically 2 (two) times daily.  (Patient not taking: Reported on 07/05/2016) 30 g 11 Not Taking  . lidocaine (XYLOCAINE) 5 % ointment Apply 1 application topically as needed. (Patient not taking: Reported on 09/06/2016) 35.44 g 0 Not Taking at Unknown time  . metroNIDAZOLE (METROGEL) 0.75 % vaginal gel Place 1 Applicatorful vaginally at bedtime. Apply one applicatorful to vagina at bedtime for 5 days (Patient not taking: Reported on 09/06/2016) 70 g 1 Not Taking at Unknown time  . tetrahydrozoline-zinc (VISINE-AC) 0.05-0.25 % ophthalmic solution Place 2 drops into both eyes 3 (three) times daily as needed (FOR ALLERGY EYES).   More than a month at Unknown time    ROS  Works at [employer] for a few weeks  Blood pressure 124/82, pulse 86, temperature 98 F (36.7 C), temperature source Oral, resp. rate 18, last menstrual period 09/09/2016, SpO2 99 %. Physical Exam  Heart- rrr Lungs- CTAB Abd- benign  No results found for this or any previous visit (from the past 24 hour(s)).  No results found.  Assessment/Plan: DUB- unresponsive to previous treatments Plan for TAH/BS.  She understands the risks of surgery, including, but not to infection, bleeding, DVTs, damage to bowel, bladder, ureters. She wishes to proceed.     Emily Filbert 09/20/2016, 7:15 AM

## 2016-09-20 NOTE — Anesthesia Postprocedure Evaluation (Signed)
Anesthesia Post Note  Patient: Chief Financial Officer  Procedure(s) Performed: Procedure(s) (LRB): HYSTERECTOMY ABDOMINAL (N/A)     Patient location during evaluation: PACU Anesthesia Type: General Level of consciousness: awake and alert Pain management: pain level controlled Vital Signs Assessment: post-procedure vital signs reviewed and stable Respiratory status: spontaneous breathing, nonlabored ventilation, respiratory function stable and patient connected to nasal cannula oxygen Cardiovascular status: blood pressure returned to baseline and stable Postop Assessment: no signs of nausea or vomiting Anesthetic complications: no    Last Vitals:  Vitals:   09/20/16 1100 09/20/16 1110  BP: 137/87 133/82  Pulse: 96 (!) 102  Resp: (!) 23 (!) 24  Temp: 36.2 C 36.4 C    Last Pain:  Vitals:   09/20/16 1110  TempSrc:   PainSc: Asleep   Pain Goal: Patients Stated Pain Goal: 4 (09/20/16 0626)               Effie Berkshire

## 2016-09-20 NOTE — Op Note (Signed)
09/20/2016  9:16 AM  PATIENT:  Daisy Baker  41 y.o. female  PRE-OPERATIVE DIAGNOSIS:  DUB, morbid obesity, fibroids, CIN1 POST-OPERATIVE DIAGNOSIS:  Same + dense adhesions of bladder and bowel to uterus  PROCEDURE:  Procedure(s): HYSTERECTOMY ABDOMINAL (N/A)  SURGEON:  Surgeon(s) and Role:    * Donn Wilmot, Wilhemina Cash, MD - Primary    * Aletha Halim, MD - Assisting   ANESTHESIA:   local and general  EBL:  Total I/O In: 2000 [I.V.:2000] Out: 350 [Urine:150; Blood:200]  BLOOD ADMINISTERED:none  DRAINS: none   LOCAL MEDICATIONS USED:  MARCAINE     SPECIMEN:  Source of Specimen:  uterus  DISPOSITION OF SPECIMEN:  PATHOLOGY  COUNTS:  YES  TOURNIQUET:  * No tourniquets in log *  DICTATION: .Dragon Dictation  PLAN OF CARE: Admit to inpatient   PATIENT DISPOSITION:  PACU - hemodynamically stable.   Delay start of Pharmacological VTE agent (>24hrs) due to surgical blood loss or risk of bleeding: not applicable     The risks, benefits, and alternatives of surgery were explained, understood, and accepted. Consents were signed. All questions were answered. She was taken to the operating room and general anesthesia was applied without complication. Her abdomen and vagina were prepped and draped in the usual sterile fashion. A Foley catheter was placed which drained clear urine throughout the case. A transverse incision was made approximately 2 cm above her symphysis pubis at the site of one of her previous incisions after injecting 30 mL of 0.5% marcaine in the subcutaneous tissue. The incision was carried down through the subcutaneous tissue to the fascia. Bleeding encountered was cauterized with the Bovie. The fascia was scored the midline and the fascial incision was extended bilaterally. The pyramidalis muscles were separated in a transverse fashion using electrosurgical technique. Approximately 2 cm of the rectus muscles were separated in a transverse fashion in the midline using  electrosurgical technique. Hemostasis was maintained. The peritoneum was entered with hemostats and the peritoneal incision was extended bilaterally with the Bovie, taking care to avoid bowel and bladder. The patient was placed in Trendelenburg position and her bowel was packed out of the abdominal cavity. The pelvis was inspected. Her uterus was densely adherent to bowel and bladder. I carefully dissected the adhesions off of the uterus.   Coker clamps were used to elevate the uterus. The round ligaments were identified clamped cut and ligated. A bladder flap was created anteriorly and the bladder was pushed out of the operative site with a moist lap sponge. The uteroovarian ligaments were identified bilaterally. The ovaries appeared normal. The oviducts were already removed.  The utero ovarian ligaments were clamped, cut, and ligated. Excellent hemostasis was noted. 2-0 Vicryl sutures used throughout this case unless otherwise specified. The uterine vessels were skeletonized, clamped, cut, and doubly ligated. I then amputated and removed the uterus for better visualization of the nulliparous long cervix.  The cervix was then separated from its pelvic attachments using the same clamp, cut, ligate technique. Curved Heaney clamps were used to clamp beneath the cervix. The cervix and uterus were removed and sent to pathology. The vaginal cuff was noted to be hemostatic after placing 2 figure of eight sutures in the midline. The pelvis was irrigated with 1 L of warm normal saline. All pedicles were noted to be hemostatic. The ureters were noted to be functioning and of normal caliber. The sponges were removed from the pelvis. The rectus muscles were inspected and hemostasis was assured. The fascia was  closed with a 1 PDS loop in a running nonlocking fashion. The subcutaneous tissue was irrigated, clean, dry.  A subcuticular closure was done with 3-0 vicryl suture. Steristrips were placed across the incision. She  tolerated the procedure well and was taken to the recovery room in stable condition. Her Foley catheter drained clear urine throughout.

## 2016-09-20 NOTE — Transfer of Care (Signed)
Immediate Anesthesia Transfer of Care Note  Patient: Daisy Baker  Procedure(s) Performed: Procedure(s): HYSTERECTOMY ABDOMINAL (N/A)  Patient Location: PACU  Anesthesia Type:General  Level of Consciousness: awake, alert  and oriented  Airway & Oxygen Therapy: Patient Spontanous Breathing and Patient connected to face mask oxygen  Post-op Assessment: Report given to RN and Post -op Vital signs reviewed and stable  Post vital signs: Reviewed and stable  Last Vitals:  Vitals:   09/20/16 0626  BP: 124/82  Pulse: 86  Resp: 18  Temp: 36.7 C    Last Pain:  Vitals:   09/20/16 0626  TempSrc: Oral      Patients Stated Pain Goal: 4 (76/28/31 5176)  Complications: No apparent anesthesia complications

## 2016-09-21 ENCOUNTER — Encounter (HOSPITAL_COMMUNITY): Payer: Self-pay | Admitting: Obstetrics & Gynecology

## 2016-09-21 LAB — CBC
HCT: 35.5 % — ABNORMAL LOW (ref 36.0–46.0)
Hemoglobin: 11.4 g/dL — ABNORMAL LOW (ref 12.0–15.0)
MCH: 27.9 pg (ref 26.0–34.0)
MCHC: 32.1 g/dL (ref 30.0–36.0)
MCV: 86.8 fL (ref 78.0–100.0)
Platelets: 315 10*3/uL (ref 150–400)
RBC: 4.09 MIL/uL (ref 3.87–5.11)
RDW: 14.3 % (ref 11.5–15.5)
WBC: 10.3 10*3/uL (ref 4.0–10.5)

## 2016-09-21 MED ORDER — BISACODYL 10 MG RE SUPP
10.0000 mg | Freq: Every day | RECTAL | Status: DC | PRN
Start: 2016-09-21 — End: 2016-09-22
  Administered 2016-09-21: 10 mg via RECTAL
  Filled 2016-09-21: qty 1

## 2016-09-21 MED ORDER — OXYCODONE-ACETAMINOPHEN 5-325 MG PO TABS: 1.0000 | ORAL_TABLET | Freq: Four times a day (QID) | ORAL | 0 refills | Status: DC | PRN

## 2016-09-21 MED ORDER — TRAMADOL HCL 50 MG PO TABS
50.0000 mg | ORAL_TABLET | Freq: Four times a day (QID) | ORAL | Status: DC | PRN
  Administered 2016-09-21 – 2016-09-22 (×2): 50 mg via ORAL
  Filled 2016-09-21 (×2): qty 1

## 2016-09-21 NOTE — Plan of Care (Signed)
Problem: Bowel/Gastric: Goal: Gastrointestinal status for postoperative course will improve Outcome: Progressing Dulcolax suppository given this am, as well as warm prune/apple juice. Ambulation encouraged. Pt has passed gas a few times this am. Marry Guan

## 2016-09-21 NOTE — Progress Notes (Signed)
1 Day Post-Op Procedure(s) (LRB): HYSTERECTOMY ABDOMINAL (N/A)  Subjective: Patient reports tolerating PO, liquids, ambulating, voiding. No nausea/vomitting, but no flatus.    Objective: I have reviewed patient's vital signs, intake and output, medications and labs.  General: alert Resp: clear to auscultation bilaterally Cardio: regular rate and rhythm, S1, S2 normal, no murmur, click, rub or gallop GI: hypoactive bowel sounds, no tympany  Assessment: s/p Procedure(s): HYSTERECTOMY ABDOMINAL (N/A): stable  Plan: Encourage ambulation dulcolax supp  I won't advance diet until flatus is present  LOS: 1 day    [place] 09/21/2016, 7:02 AM

## 2016-09-21 NOTE — Discharge Instructions (Signed)
Abdominal Hysterectomy, Care After °This sheet gives you information about how to care for yourself after your procedure. Your doctor may also give you more specific instructions. If you have problems or questions, contact your doctor. °Follow these instructions at home: °Bathing °· Do not take baths, swim, or use a hot tub until your doctor says it is okay. Ask your doctor if you can take showers. You may only be allowed to take sponge baths for bathing. °· Keep the bandage (dressing) dry until your doctor says it can be taken off. °Surgical cut ( °incision) care °· Follow instructions from your doctor about how to take care of your cut from surgery. Make sure you: °? Wash your hands with soap and water before you change your bandage (dressing). If you cannot use soap and water, use hand sanitizer. °? Change your bandage as told by your doctor. °? Leave stitches (sutures), skin glue, or skin tape (adhesive) strips in place. They may need to stay in place for 2 weeks or longer. If tape strips get loose and curl up, you may trim the loose edges. Do not remove tape strips completely unless your doctor says it is okay. °· Check your surgical cut area every day for signs of infection. Check for: °? Redness, swelling, or pain. °? Fluid or blood. °? Warmth. °? Pus or a bad smell. °Activity °· Do gentle, daily exercise as told by your doctor. You may be told to take short walks every day and go farther each time. °· Do not lift anything that is heavier than 10 lb (4.5 kg), or the limit that your doctor tells you, until he or she says that it is safe. °· Do not drive or use heavy machinery while taking prescription pain medicine. °· Do not drive for 24 hours if you were given a medicine to help you relax (sedative). °· Follow your doctor's advice about exercise, driving, and general activities. Ask your doctor what activities are safe for you. °Lifestyle °· Do not douche, use tampons, or have sex for at least 6 weeks or as  told by your doctor. °· Do not drink alcohol until your doctor says it is okay. °· Drink enough fluid to keep your pee (urine) clear or pale yellow. °· Try to have someone at home with you for the first 1-2 weeks to help. °· Do not use any products that contain nicotine or tobacco, such as cigarettes and e-cigarettes. These can slow down healing. If you need help quitting, ask your doctor. °General instructions °· Take over-the-counter and prescription medicines only as told by your doctor. °· Do not take aspirin or ibuprofen. These medicines can cause bleeding. °· To prevent or treat constipation while you are taking prescription pain medicine, your doctor may suggest that you: °? Drink enough fluid to keep your urine clear or pale yellow. °? Take over-the-counter or prescription medicines. °? Eat foods that are high in fiber, such as: °§ Fresh fruits and vegetables. °§ Whole grains. °§ Beans. °? Limit foods that are high in fat and processed sugars, such as fried and sweet foods. °· Keep all follow-up visits as told by your doctor. This is important. °Contact a doctor if: °· You have chills or fever. °· You have redness, swelling, or pain around your cut. °· You have fluid or blood coming from your cut. °· Your cut feels warm to the touch. °· You have pus or a bad smell coming from your cut. °· Your cut breaks   open. °· You feel dizzy or light-headed. °· You have pain or bleeding when you pee. °· You keep having watery poop (diarrhea). °· You keep feeling sick to your stomach (nauseous) or keep throwing up (vomiting). °· You have unusual fluid (discharge) coming from your vagina. °· You have a rash. °· You have a reaction to your medicine. °· Your pain medicine does not help. °Get help right away if: °· You have a fever and your symptoms get worse all of a sudden. °· You have very bad belly (abdominal) pain. °· You are short of breath. °· You pass out (faint). °· You have pain, swelling, or redness of your  leg. °· You bleed a lot from your vagina and notice clumps of blood (clots). °Summary °· Do not take baths, swim, or use a hot tub until your doctor says it is okay. Ask your doctor if you can take showers. You may only be allowed to take sponge baths for bathing. °· Follow your doctor's advice about exercise, driving, and general activities. Ask your doctor what activities are safe for you. °· Do not lift anything that is heavier than 10 lb (4.5 kg), or the limit that your doctor tells you, until he or she says that it is safe. °· Try to have someone at home with you for the first 1-2 weeks to help. °This information is not intended to replace advice given to you by your health care provider. Make sure you discuss any questions you have with your health care provider. °Document Released: 12/27/2007 Document Revised: 03/07/2016 Document Reviewed: 03/07/2016 °Elsevier Interactive Patient Education © 2017 [employer] ° °

## 2016-09-21 NOTE — Progress Notes (Signed)
Pt ambulated around entire nursing unit, accompanied by this RN. Pt tolerated very well & states that her pain level is very tolerable now at 4/10. Marry Guan

## 2016-09-22 MED ORDER — TRAMADOL HCL 50 MG PO TABS: 50.0000 mg | ORAL_TABLET | Freq: Four times a day (QID) | ORAL | 0 refills | Status: DC | PRN

## 2016-09-22 NOTE — Progress Notes (Signed)

## 2016-09-22 NOTE — Discharge Summary (Signed)
Physician Discharge Summary  Patient ID: Daisy Baker MRN: [id number] DOB/AGE: [birth year] 41 y.o.  Admit date: 09/20/2016 Discharge date: 09/22/2016  Admission Diagnoses: DUB Discharge Diagnoses:  Active Problems:   Post-operative state   Discharged Condition: good  Hospital Course: Pt was admitted with above Dx and underwent TAH with LOA. See OP note for additional information. Post op course was unremarkable. Progressed to ambulating and voiding, passing flatus, tolerating diet and good oral pain control. Felt amendable for discharge home on POD # 2.  Consults: None  Significant Diagnostic Studies: none  Treatments: surgery: TAH/LOA  Discharge Exam: Blood pressure 120/72, pulse 98, temperature 98.6 F (37 C), temperature source Oral, resp. rate 20, height 5\' 3"  (1.6 m), weight 134.3 kg (296 lb), last menstrual period 09/09/2016, SpO2 98 %. Lungs clear Heart RRR Abd soft + BS , drsg intact Ext non tender  Disposition: 01-Home or Self Care  Discharge Instructions    Call MD for:  difficulty breathing, headache or visual disturbances    Complete by:  As directed    Call MD for:  persistant nausea and vomiting    Complete by:  As directed    Call MD for:  redness, tenderness, or signs of infection (pain, swelling, redness, odor or green/yellow discharge around incision site)    Complete by:  As directed    Call MD for:  severe uncontrolled pain    Complete by:  As directed    Call MD for:  temperature >100.4    Complete by:  As directed    Diet general    Complete by:  As directed    Increase activity slowly    Complete by:  As directed       Follow-up Information    Emily Filbert, MD. Schedule an appointment as soon as possible for a visit in 4 week(s).   Specialty:  Obstetrics and Gynecology Why:  Post op appt. Contact information: [address] [place] [address] [phone]           Signed: Chancy Milroy 09/22/2016, 11:46 AM

## 2016-10-22 DIAGNOSIS — Z90710 Acquired absence of both cervix and uterus: Secondary | ICD-10-CM | POA: Insufficient documentation

## 2016-10-23 ENCOUNTER — Encounter: Payer: Self-pay | Admitting: Obstetrics & Gynecology

## 2016-10-23 ENCOUNTER — Ambulatory Visit (INDEPENDENT_AMBULATORY_CARE_PROVIDER_SITE_OTHER): Payer: BC Managed Care – PPO | Admitting: Obstetrics & Gynecology

## 2016-10-23 VITALS — BP 145/88 | HR 98 | Wt 294.0 lb

## 2016-10-23 DIAGNOSIS — Z9889 Other specified postprocedural states: Secondary | ICD-10-CM

## 2016-10-23 DIAGNOSIS — N760 Acute vaginitis: Secondary | ICD-10-CM

## 2016-10-23 DIAGNOSIS — B9689 Other specified bacterial agents as the cause of diseases classified elsewhere: Secondary | ICD-10-CM

## 2016-10-23 MED ORDER — METRONIDAZOLE 0.75 % VA GEL: 1.0000 | Freq: Every day | VAGINAL | 6 refills | Status: DC

## 2016-10-23 NOTE — Progress Notes (Signed)
   Subjective:    Patient ID: Daisy Baker, female    DOB: Dec 29, 1975, 41 y.o.   MRN: [id number]  HPI  41 yo lady here 39 weeks post op s/p TAH/BS for DUB/pain. Her pathology showed fibroids and adenomyosis. She reports that she has had difficulty sleeping and got some sleeping aid from her primary care MD yesterday. She also reports and increased libido but has not had sex since surgery. She thinks that she has BV back, requesting the gel.  Review of Systems     Objective:   Physical Exam Pleasant obese BFNAD Breathing, conversing, and ambulating normally Abd- benign Incision- healed well Cuff- healed well Vaginal discharge c/w BV   Assessment & Plan:  Post op state- doing well BV- metrogel with refills

## 2017-03-13 DIAGNOSIS — R7303 Prediabetes: Secondary | ICD-10-CM | POA: Insufficient documentation

## 2017-05-02 ENCOUNTER — Other Ambulatory Visit: Payer: Self-pay | Admitting: Obstetrics & Gynecology

## 2017-05-02 DIAGNOSIS — N898 Other specified noninflammatory disorders of vagina: Secondary | ICD-10-CM

## 2017-05-03 NOTE — Telephone Encounter (Signed)
-----   Message from Blanchie Dessert, [place] sent at 05/03/2017 10:40 AM EST ----- Regarding: pt has a question about rx Contact: [phone] Please call patient she is needing to speak to someone about getting a generic refill for her rx.

## 2017-05-04 ENCOUNTER — Emergency Department (HOSPITAL_COMMUNITY)
Admission: EM | Admit: 2017-05-04 | Discharge: 2017-05-04 | Disposition: A | Discharge status: Home / Self Care | Payer: BC Managed Care – PPO | Attending: Emergency Medicine | Admitting: Emergency Medicine

## 2017-05-04 ENCOUNTER — Other Ambulatory Visit: Payer: Self-pay

## 2017-05-04 ENCOUNTER — Encounter (HOSPITAL_COMMUNITY): Payer: Self-pay

## 2017-05-04 DIAGNOSIS — J029 Acute pharyngitis, unspecified: Secondary | ICD-10-CM

## 2017-05-04 DIAGNOSIS — B9789 Other viral agents as the cause of diseases classified elsewhere: Secondary | ICD-10-CM | POA: Insufficient documentation

## 2017-05-04 DIAGNOSIS — J45909 Unspecified asthma, uncomplicated: Secondary | ICD-10-CM | POA: Insufficient documentation

## 2017-05-04 DIAGNOSIS — Z79899 Other long term (current) drug therapy: Secondary | ICD-10-CM | POA: Diagnosis not present

## 2017-05-04 LAB — RAPID STREP SCREEN (MED CTR MEBANE ONLY): Streptococcus, Group A Screen (Direct): NEGATIVE

## 2017-05-04 MED ORDER — DEXAMETHASONE SODIUM PHOSPHATE 10 MG/ML IJ SOLN
10.0000 mg | Freq: Once | INTRAMUSCULAR | Status: AC
  Administered 2017-05-04: 10 mg via INTRAMUSCULAR
  Filled 2017-05-04: qty 1

## 2017-05-04 NOTE — ED Provider Notes (Signed)
[facility] EMERGENCY DEPARTMENT Provider Note   CSN: [id number] Arrival date & time: 05/04/17  0735     History   Chief Complaint Chief Complaint  Patient presents with  . Sore Throat  . Chest Pain    HPI Daisy Baker is a 42 y.o. female.  42 year old female presents with 3 days of URI symptoms consisting of sore throat ear fullness.  No diarrhea or vomiting.  Did have wheezing several days ago that was relieved by her inhaler.  Does have a history of asthma.  Denies any dyspnea.  Symptoms unresponsive to over-the-counter medications.  Has had positive sick exposures.  Nothing makes her symptoms better      Past Medical History:  Diagnosis Date  . Allergy   . Anemia   . Asthma   . Headache    Migraines  . PONV (postoperative nausea and vomiting)   . Sleep apnea    does not use CPAP - just picked it up  . SVD (spontaneous vaginal delivery)    x 1  . Vertigo     Patient Active Problem List   Diagnosis Date Noted  . Post-operative state 09/20/2016  . LGSIL of cervix of undetermined significance 07/05/2016  . Yeast infection involving the vagina and surrounding area 06/28/2016  . Vaginal irritation 06/28/2016  . Bacterial vaginosis 06/28/2016    Past Surgical History:  Procedure Laterality Date  . ABDOMINAL HYSTERECTOMY N/A 09/20/2016   Procedure: HYSTERECTOMY ABDOMINAL;  Surgeon: Emily Filbert, MD;  Location: [facility];  Service: Gynecology;  Laterality: N/A;  . CESAREAN SECTION    . DILITATION & CURRETTAGE/HYSTROSCOPY WITH NOVASURE ABLATION N/A 09/02/2015   Procedure: DILATATION & CURETTAGE WITH ATTEMPTED NOVASURE ABLATION;  Surgeon: Emily Filbert, MD;  Location: [facility];  Service: Gynecology;  Laterality: N/A;  . LAPAROSCOPIC TUBAL LIGATION Bilateral 09/02/2015   Procedure: LAPAROSCOPIC BILATERAL SALPINGECTOMY, LYSIS OF ADHESIONS;  Surgeon: Emily Filbert, MD;  Location: [facility];  Service: Gynecology;  Laterality: Bilateral;  . MYOMECTOMY  2011  .  NORPLANT REMOVAL Left 09/02/2015   Procedure: REMOVAL OF NORPLANT;  Surgeon: Emily Filbert, MD;  Location: [facility];  Service: Gynecology;  Laterality: Left;    OB History    Gravida Para Term Preterm AB Living   2 2 1 1   2    SAB TAB Ectopic Multiple Live Births           2       Home Medications    Prior to Admission medications   Medication Sig Start Date End Date Taking? Authorizing Provider  acetaminophen (TYLENOL) 500 MG tablet Take 500-1,000 mg by mouth every 6 (six) hours as needed (for pain.).     [provider]  albuterol (PROVENTIL HFA;VENTOLIN HFA) 108 (90 Base) MCG/ACT inhaler Inhale 2 puffs into the lungs every 6 (six) hours as needed for wheezing or shortness of breath.  09/12/15 09/11/16  [provider]  aspirin-acetaminophen-caffeine (EXCEDRIN MIGRAINE) [phone] MG tablet Take 2 tablets by mouth every 6 (six) hours as needed (for migraine headache.).    [provider]  dicyclomine (BENTYL) 10 MG capsule Take 10 mg by mouth 4 (four) times daily as needed (for abdominal spasms/ibs).  05/02/16 05/02/17  [provider]  fexofenadine (ALLEGRA) 180 MG tablet Take 180 mg by mouth daily.  01/17/16   [provider]  fexofenadine-pseudoephedrine (ALLEGRA-D) 60-120 MG per tablet Take 1 tablet by mouth every 12 (twelve) hours. Patient taking differently: Take 1  tablet by mouth 2 (two) times daily as needed (for sinus issues/allergies.).  11/18/14   Junius Creamer, NP  fluticasone [facility]) 50 MCG/ACT nasal spray Place 1 spray into the nose daily as needed (for allergies.).  01/17/16 01/16/17  [provider]  ibuprofen (ADVIL,MOTRIN) 600 MG tablet Take 1 tablet (600 mg total) by mouth every 6 (six) hours as needed. Patient taking differently: Take 600 mg by mouth every 6 (six) hours as needed (for pain.).  09/02/15   Emily Filbert, MD  metroNIDAZOLE (FLAGYL) 500 MG tablet TAKE ONE TABLET BY MOUTH TWICE DAILY 05/03/17   Clovia Cuff C, MD    metroNIDAZOLE (METROGEL) 0.75 % vaginal gel Place 1 Applicatorful vaginally at bedtime. Apply one applicatorful to vagina at bedtime for 5 days 10/23/16   Emily Filbert, MD  tetrahydrozoline-zinc (VISINE-AC) 0.05-0.25 % ophthalmic solution Place 2 drops into both eyes 3 (three) times daily as needed (FOR ALLERGY EYES).    [provider]  traMADol (ULTRAM) 50 MG tablet Take 1 tablet (50 mg total) by mouth every 6 (six) hours as needed. Patient taking differently: Take 50 mg by mouth every 6 (six) hours as needed (for pain.).  06/05/16   Emily Filbert, MD  traMADol (ULTRAM) 50 MG tablet Take 1 tablet (50 mg total) by mouth every 6 (six) hours as needed (pain). 09/22/16   Chancy Milroy, MD    Family History Family History  Problem Relation Age of Onset  . Hypertension Mother   . Diabetes Mother   . Asthma Mother   . Hypertension Father   . Diabetes Father     Social History Social History   Tobacco Use  . Smoking status: Never Smoker  . Smokeless tobacco: Never Used  Substance Use Topics  . Alcohol use: Yes    Comment: social  . Drug use: No     Allergies   Patient has no known allergies.   Review of Systems Review of Systems  All other systems reviewed and are negative.    Physical Exam Updated Vital Signs BP (!) 150/88 (BP Location: Left Arm)   Pulse 93   Temp 98.3 F (36.8 C)   Resp 20   Ht 1.6 m (5\' 3" )   Wt 127 kg (280 lb)   LMP 09/09/2016 (Exact Date)   SpO2 98%   BMI 49.60 kg/m   Physical Exam  Constitutional: She is oriented to person, place, and time. She appears well-developed and well-nourished.  Non-toxic appearance. No distress.  HENT:  Head: Normocephalic and atraumatic.  Left Ear: Tympanic membrane normal.  Mouth/Throat: Oropharyngeal exudate, posterior oropharyngeal edema and posterior oropharyngeal erythema present.  Right ear cerumen impaction noted  Eyes: Conjunctivae, EOM and lids are normal. Pupils are equal, round, and reactive  to light.  Neck: Normal range of motion. Neck supple. No tracheal deviation present. No thyroid mass present.  Cardiovascular: Normal rate, regular rhythm and normal heart sounds. Exam reveals no gallop.  No murmur heard. Pulmonary/Chest: Effort normal and breath sounds normal. No stridor. No respiratory distress. She has no decreased breath sounds. She has no wheezes. She has no rhonchi. She has no rales.  Abdominal: Soft. Normal appearance and bowel sounds are normal. She exhibits no distension. There is no tenderness. There is no rebound and no CVA tenderness.  Musculoskeletal: Normal range of motion. She exhibits no edema or tenderness.  Neurological: She is alert and oriented to person, place, and time. She has normal strength. No cranial nerve  deficit or sensory deficit. GCS eye subscore is 4. GCS verbal subscore is 5. GCS motor subscore is 6.  Skin: Skin is warm and dry. No abrasion and no rash noted.  Psychiatric: She has a normal mood and affect. Her speech is normal and behavior is normal.  Nursing note and vitals reviewed.    ED Treatments / Results  Labs (all labs ordered are listed, but only abnormal results are displayed) Labs Reviewed  RAPID STREP SCREEN (NOT AT [facility])    EKG  EKG Interpretation None       Radiology No results found.  Procedures Procedures (including critical care time)  Medications Ordered in ED Medications - No data to display   Initial Impression / Assessment and Plan / ED Course  I have reviewed the triage vital signs and the nursing notes.  Pertinent labs & imaging results that were available during my care of the patient were reviewed by me and considered in my medical decision making (see chart for details).    Strep test negative.  Given IM shot of Decadron for tonsillar edema.  Suspect viral illness.  Return precautions given  Final Clinical Impressions(s) / ED Diagnoses   Final diagnoses:  None    ED Discharge Orders     None       Lacretia Leigh, MD 05/04/17 [phone]

## 2017-05-04 NOTE — ED Triage Notes (Signed)
Pt started having a sore throat and chest pain over the past 3 days pt states she took her inhaler yesterday for wheezing that has since resolved.

## 2017-05-06 LAB — CULTURE, GROUP A STREP (THRC)

## 2017-10-02 ENCOUNTER — Ambulatory Visit (INDEPENDENT_AMBULATORY_CARE_PROVIDER_SITE_OTHER): Payer: BC Managed Care – PPO | Admitting: Obstetrics & Gynecology

## 2017-10-02 ENCOUNTER — Other Ambulatory Visit (HOSPITAL_COMMUNITY)
Admission: RE | Admit: 2017-10-02 | Discharge: 2017-10-02 | Disposition: A | Discharge status: Home / Self Care | Payer: BC Managed Care – PPO | Source: Ambulatory Visit | Attending: Obstetrics & Gynecology | Admitting: Obstetrics & Gynecology

## 2017-10-02 ENCOUNTER — Encounter: Payer: Self-pay | Admitting: Obstetrics & Gynecology

## 2017-10-02 VITALS — BP 142/76 | HR 72 | Wt 292.6 lb

## 2017-10-02 DIAGNOSIS — Z01419 Encounter for gynecological examination (general) (routine) without abnormal findings: Secondary | ICD-10-CM | POA: Diagnosis not present

## 2017-10-02 DIAGNOSIS — N898 Other specified noninflammatory disorders of vagina: Secondary | ICD-10-CM | POA: Diagnosis present

## 2017-10-02 NOTE — Progress Notes (Signed)
Subjective:    Daisy Baker is a 42 y.o. single P2  female who presents for an annual exam. The patient has no complaints today except that she is having either yeast or BV. She also reports some vaginal dryness The patient is sexually active. GYN screening history: last pap: was normal. The patient wears seatbelts: yes. The patient participates in regular exercise: yes. Has the patient ever been transfused or tattooed?: yes. The patient reports that there is not domestic violence in her life.   Menstrual History: OB History    Gravida  2   Para  2   Term  1   Preterm  1   AB      Living  2     SAB      TAB      Ectopic      Multiple      Live Births  2           Menarche age: 4 Patient's last menstrual period was 09/09/2016 (exact date).    The following portions of the patient's history were reviewed and updated as appropriate: allergies, current medications, past family history, past medical history, past social history, past surgical history and problem list.  Review of Systems Pertinent items are noted in HPI.   No breast/gyn/colon cancer Fam Med doc is [employer] Monogamous for about 2 years (FOB) Works at [employer], Art gallery manager.   Objective:    BP (!) 142/76   Pulse 72   Wt 292 lb 9.6 oz (132.7 kg)   LMP 09/09/2016 (Exact Date)   BMI 51.83 kg/m   General Appearance:    Alert, cooperative, no distress, appears stated age  Head:    Normocephalic, without obvious abnormality, atraumatic  Eyes:    PERRL, conjunctiva/corneas clear, EOM's intact, fundi    benign, both eyes  Ears:    Normal TM's and external ear canals, both ears  Nose:   Nares normal, septum midline, mucosa normal, no drainage    or sinus tenderness  Throat:   Lips, mucosa, and tongue normal; teeth and gums normal  Neck:   Supple, symmetrical, trachea midline, no adenopathy;    thyroid:  no enlargement/tenderness/nodules; no carotid   bruit or JVD  Back:      Symmetric, no curvature, ROM normal, no CVA tenderness  Lungs:     Clear to auscultation bilaterally, respirations unlabored  Chest Wall:    No tenderness or deformity   Heart:    Regular rate and rhythm, S1 and S2 normal, no murmur, rub   or gallop  Breast Exam:    No tenderness, masses, or nipple abnormality  Abdomen:     Soft, non-tender, bowel sounds active all four quadrants,    no masses, no organomegaly  Genitalia:    Normal female without lesion, discharge or tenderness, no masses or tenderness with bimanual exam     Extremities:   Extremities normal, atraumatic, no cyanosis or edema  Pulses:   2+ and symmetric all extremities  Skin:   Skin color, texture, turgor normal, no rashes or lesions  Lymph nodes:   Cervical, supraclavicular, and axillary nodes normal  Neurologic:   CNII-XII intact, normal strength, sensation and reflexes    throughout  .    Assessment:    Healthy female exam.   Vaginal discharge   Plan:     wet prep Mammogram Rec healthy lifestyle changes

## 2017-10-02 NOTE — Progress Notes (Signed)
NEEDS MAMMOGRAM

## 2017-10-04 ENCOUNTER — Other Ambulatory Visit: Payer: Self-pay | Admitting: Obstetrics & Gynecology

## 2017-10-04 DIAGNOSIS — Z1231 Encounter for screening mammogram for malignant neoplasm of breast: Secondary | ICD-10-CM

## 2017-10-08 LAB — CERVICOVAGINAL ANCILLARY ONLY
Chlamydia: NEGATIVE
Neisseria Gonorrhea: NEGATIVE
Trichomonas: NEGATIVE

## 2017-10-09 LAB — URINE CYTOLOGY ANCILLARY ONLY
Bacterial vaginitis: NEGATIVE
Candida vaginitis: NEGATIVE

## 2017-11-05 ENCOUNTER — Ambulatory Visit: Payer: BC Managed Care – PPO

## 2017-11-08 ENCOUNTER — Ambulatory Visit
Admission: RE | Admit: 2017-11-08 | Discharge: 2017-11-08 | Disposition: A | Discharge status: Home / Self Care | Payer: BC Managed Care – PPO | Source: Ambulatory Visit | Attending: Obstetrics & Gynecology | Admitting: Obstetrics & Gynecology

## 2017-11-08 DIAGNOSIS — Z1231 Encounter for screening mammogram for malignant neoplasm of breast: Secondary | ICD-10-CM

## 2018-05-13 DIAGNOSIS — I10 Essential (primary) hypertension: Secondary | ICD-10-CM | POA: Insufficient documentation

## 2018-08-08 ENCOUNTER — Other Ambulatory Visit: Payer: Self-pay

## 2018-08-08 ENCOUNTER — Other Ambulatory Visit (HOSPITAL_COMMUNITY)
Admission: RE | Admit: 2018-08-08 | Discharge: 2018-08-08 | Disposition: A | Discharge status: Home / Self Care | Payer: BC Managed Care – PPO | Source: Ambulatory Visit | Attending: Obstetrics & Gynecology | Admitting: Obstetrics & Gynecology

## 2018-08-08 ENCOUNTER — Ambulatory Visit (INDEPENDENT_AMBULATORY_CARE_PROVIDER_SITE_OTHER): Payer: BC Managed Care – PPO | Admitting: *Deleted

## 2018-08-08 VITALS — BP 139/90 | HR 87

## 2018-08-08 DIAGNOSIS — N898 Other specified noninflammatory disorders of vagina: Secondary | ICD-10-CM

## 2018-08-08 DIAGNOSIS — B9689 Other specified bacterial agents as the cause of diseases classified elsewhere: Secondary | ICD-10-CM

## 2018-08-08 DIAGNOSIS — N760 Acute vaginitis: Secondary | ICD-10-CM

## 2018-08-08 NOTE — Progress Notes (Signed)
SUBJECTIVE:  43 y.o. female complains of  vaginal discharge for  2 weeks. Pt has been treating her self with OTC meds for yeast infection and is has not helped. Pt does state she now has an odorous discharge.  Denies abnormal vaginal bleeding or significant pelvic pain or fever. No UTI symptoms. Denies history of known exposure to STD.  Patient's last menstrual period was 09/09/2016 (exact date).  OBJECTIVE:  She appears well, afebrile.   ASSESSMENT:  Vaginal Discharge  Vaginal Odor   PLAN:  BVAG, CVAG probe sent to lab. Treatment: To be determined once lab results are received ROV prn if symptoms persist or worsen.

## 2018-08-11 LAB — CERVICOVAGINAL ANCILLARY ONLY
Bacterial vaginitis: POSITIVE — AB
Candida vaginitis: NEGATIVE

## 2018-08-11 MED ORDER — METRONIDAZOLE 500 MG PO TABS: 500.0000 mg | ORAL_TABLET | Freq: Two times a day (BID) | ORAL | 2 refills | Status: AC

## 2018-08-11 NOTE — Addendum Note (Signed)
Addended by: Verita Schneiders A on: 08/11/2018 01:15 PM   Modules accepted: Orders

## 2018-08-11 NOTE — Progress Notes (Signed)
I have reviewed the chart and agree with nursing staff's documentation of this patient's encounter.  Verita Schneiders, MD 08/11/2018 11:32 AM

## 2018-08-28 ENCOUNTER — Other Ambulatory Visit: Payer: Self-pay | Admitting: *Deleted

## 2018-08-28 MED ORDER — FLUCONAZOLE 150 MG PO TABS: 150.0000 mg | ORAL_TABLET | Freq: Once | ORAL | 1 refills | Status: AC

## 2018-10-15 ENCOUNTER — Encounter: Payer: Self-pay | Admitting: Radiology

## 2018-11-13 ENCOUNTER — Encounter: Payer: Self-pay | Admitting: Obstetrics & Gynecology

## 2018-11-13 ENCOUNTER — Other Ambulatory Visit: Payer: Self-pay

## 2018-11-13 ENCOUNTER — Ambulatory Visit (INDEPENDENT_AMBULATORY_CARE_PROVIDER_SITE_OTHER): Payer: BC Managed Care – PPO | Admitting: Obstetrics & Gynecology

## 2018-11-13 VITALS — BP 137/79 | HR 94 | Ht 63.0 in | Wt 300.0 lb

## 2018-11-13 DIAGNOSIS — Z01419 Encounter for gynecological examination (general) (routine) without abnormal findings: Secondary | ICD-10-CM | POA: Diagnosis not present

## 2018-11-13 NOTE — Progress Notes (Signed)
Subjective:    Daisy Baker is a 43 y.o. female who presents for an annual exam. The patient has no complaints today. The patient is sexually active. GYN screening history: last pap: was normal. The patient wears seatbelts: yes. The patient participates in regular exercise: yes. Has the patient ever been transfused or tattooed?: yes. The patient reports that there is not domestic violence in her life.   Menstrual History: OB History    Gravida  2   Para  2   Term  1   Preterm  1   AB      Living  2     SAB      TAB      Ectopic      Multiple      Live Births  2           Menarche age: 77 Patient's last menstrual period was 09/09/2016 (exact date).    The following portions of the patient's history were reviewed and updated as appropriate: allergies, current medications, past family history, past medical history, past social history, past surgical history and problem list.  Review of Systems Pertinent items are noted in HPI.   FH- no breast/gyn/colon cancer She is trying to lose weight so that she might be able to donate a kidney to her mom who is on dialysis. Monogamous for about 3 years Has a fam med doc and plans to get her fasting labs there.   Objective:    BP 137/79   Pulse 94   Ht 5\' 3"  (1.6 m)   Wt 300 lb (136.1 kg)   LMP 09/09/2016 (Exact Date)   BMI 53.14 kg/m   General Appearance:    Alert, cooperative, no distress, appears stated age  Head:    Normocephalic, without obvious abnormality, atraumatic  Eyes:    PERRL, conjunctiva/corneas clear, EOM's intact, fundi    benign, both eyes  Ears:    Normal TM's and external ear canals, both ears  Nose:   Nares normal, septum midline, mucosa normal, no drainage    or sinus tenderness  Throat:   Lips, mucosa, and tongue normal; teeth and gums normal  Neck:   Supple, symmetrical, trachea midline, no adenopathy;    thyroid:  no enlargement/tenderness/nodules; no carotid   bruit or JVD  Back:      Symmetric, no curvature, ROM normal, no CVA tenderness  Lungs:     Clear to auscultation bilaterally, respirations unlabored  Chest Wall:    No tenderness or deformity   Heart:    Regular rate and rhythm, S1 and S2 normal, no murmur, rub   or gallop  Breast Exam:    No tenderness, masses, or nipple abnormality  Abdomen:     Soft, non-tender, bowel sounds active all four quadrants,    no masses, no organomegaly  Genitalia:    Normal female without lesion, discharge or tenderness, spec exam normal, bimanual exam reveals no masses or tenderness     Extremities:   Extremities normal, atraumatic, no cyanosis or edema  Pulses:   2+ and symmetric all extremities  Skin:   Skin color, texture, turgor normal, no rashes or lesions  Lymph nodes:   Cervical, supraclavicular, and axillary nodes normal  Neurologic:   CNII-XII intact, normal strength, sensation and reflexes    throughout  .    Assessment:    Healthy female exam.   Recurrent boric acid   Plan:     Discussed healthy lifestyle modifications.  Rec boric acid prn for bv

## 2018-11-13 NOTE — Progress Notes (Signed)
having issues with recurrent yeast and BV infections since getting hysterectomy

## 2018-12-03 DIAGNOSIS — F32A Depression, unspecified: Secondary | ICD-10-CM | POA: Insufficient documentation

## 2018-12-16 ENCOUNTER — Other Ambulatory Visit: Payer: Self-pay

## 2018-12-16 ENCOUNTER — Ambulatory Visit
Admission: RE | Admit: 2018-12-16 | Discharge: 2018-12-16 | Disposition: A | Discharge status: Home / Self Care | Payer: BC Managed Care – PPO | Source: Ambulatory Visit | Attending: Obstetrics & Gynecology | Admitting: Obstetrics & Gynecology

## 2018-12-16 DIAGNOSIS — Z01419 Encounter for gynecological examination (general) (routine) without abnormal findings: Secondary | ICD-10-CM

## 2019-07-15 DIAGNOSIS — IMO0002 Reserved for concepts with insufficient information to code with codable children: Secondary | ICD-10-CM | POA: Insufficient documentation

## 2019-09-16 ENCOUNTER — Encounter: Payer: Self-pay | Admitting: Radiology

## 2019-10-20 ENCOUNTER — Other Ambulatory Visit: Payer: Self-pay | Admitting: Physician Assistant

## 2019-10-23 DIAGNOSIS — N62 Hypertrophy of breast: Secondary | ICD-10-CM | POA: Insufficient documentation

## 2019-10-29 LAB — DIAGNOSTIC MAMMOGRAM: HM Mammogram: NORMAL (ref 0–4)

## 2019-11-09 ENCOUNTER — Other Ambulatory Visit: Payer: Self-pay

## 2019-11-09 ENCOUNTER — Ambulatory Visit (INDEPENDENT_AMBULATORY_CARE_PROVIDER_SITE_OTHER): Payer: BC Managed Care – PPO

## 2019-11-09 ENCOUNTER — Other Ambulatory Visit (HOSPITAL_COMMUNITY)
Admission: RE | Admit: 2019-11-09 | Discharge: 2019-11-09 | Disposition: A | Discharge status: Home / Self Care | Payer: BC Managed Care – PPO | Source: Ambulatory Visit | Attending: Family Medicine | Admitting: Family Medicine

## 2019-11-09 VITALS — BP 126/63 | HR 74 | Wt 283.0 lb

## 2019-11-09 DIAGNOSIS — N898 Other specified noninflammatory disorders of vagina: Secondary | ICD-10-CM | POA: Insufficient documentation

## 2019-11-09 LAB — POCT URINE QUALITATIVE DIPSTICK BLOOD: Blood, UA: NEGATIVE

## 2019-11-09 NOTE — Progress Notes (Signed)
SUBJECTIVE:  44 y.o. female complains of vaginal discharge for last couple of days.  Denies abnormal vaginal bleeding or significant pelvic pain. fever. No UTI symptoms. Denies history of known exposure to STD.  Patient's last menstrual period was 09/09/2016 (exact date).  OBJECTIVE:  She appears well, afebrile. Urine dipstick: normal   ASSESSMENT:  Vaginal Discharge yes  Vaginal Odor; small amount   PLAN:  GC, chlamydia, trichomonas, BVAG, CVAG probe sent to lab. Treatment: To be determined once lab results are received ROV prn if symptoms persist or worsen.

## 2019-11-11 LAB — CERVICOVAGINAL ANCILLARY ONLY
Bacterial Vaginitis (gardnerella): NEGATIVE
Candida Glabrata: POSITIVE — AB
Candida Vaginitis: POSITIVE — AB
Chlamydia: NEGATIVE
Comment: NEGATIVE
Comment: NEGATIVE
Comment: NEGATIVE
Comment: NEGATIVE
Comment: NEGATIVE
Comment: NORMAL
Neisseria Gonorrhea: NEGATIVE
Trichomonas: NEGATIVE

## 2019-11-12 ENCOUNTER — Telehealth: Payer: Self-pay | Admitting: *Deleted

## 2019-11-12 MED ORDER — FLUCONAZOLE 150 MG PO TABS: 150.0000 mg | ORAL_TABLET | Freq: Every day | ORAL | 2 refills | Status: DC

## 2019-11-12 NOTE — Telephone Encounter (Signed)
-----   Message from Donnamae Jude, MD sent at 11/12/2019  8:17 AM EDT ----- Has candida--can try fluconazole, but if not effective, would try boric acid capsules nightly x 14 nights--will send in rx for Diflucan and she can let us know.

## 2019-11-12 NOTE — Addendum Note (Signed)
Addended by: Donnamae Jude on: 11/12/2019 08:17 AM   Modules accepted: Orders

## 2019-11-12 NOTE — Telephone Encounter (Signed)
Pt informed of results and will let us know if Diflucan doesn't help and we will send in boric acid for her per Dr Kennon Rounds recommendations.

## 2019-11-13 ENCOUNTER — Encounter: Payer: Self-pay | Admitting: *Deleted

## 2019-11-13 ENCOUNTER — Other Ambulatory Visit: Payer: Self-pay | Admitting: *Deleted

## 2019-11-13 MED ORDER — TERCONAZOLE 0.8 % VA CREA: 1.0000 | TOPICAL_CREAM | Freq: Every day | VAGINAL | 0 refills | Status: DC

## 2019-11-18 ENCOUNTER — Encounter: Payer: Self-pay | Admitting: Advanced Practice Midwife

## 2019-11-18 ENCOUNTER — Ambulatory Visit (INDEPENDENT_AMBULATORY_CARE_PROVIDER_SITE_OTHER): Payer: BC Managed Care – PPO | Admitting: Advanced Practice Midwife

## 2019-11-18 ENCOUNTER — Other Ambulatory Visit: Payer: Self-pay

## 2019-11-18 ENCOUNTER — Other Ambulatory Visit (HOSPITAL_COMMUNITY)
Admission: RE | Admit: 2019-11-18 | Discharge: 2019-11-18 | Disposition: A | Discharge status: Home / Self Care | Payer: BC Managed Care – PPO | Source: Ambulatory Visit | Attending: Advanced Practice Midwife | Admitting: Advanced Practice Midwife

## 2019-11-18 VITALS — BP 124/84 | HR 74 | Wt 286.0 lb

## 2019-11-18 DIAGNOSIS — N760 Acute vaginitis: Secondary | ICD-10-CM | POA: Insufficient documentation

## 2019-11-18 DIAGNOSIS — B9689 Other specified bacterial agents as the cause of diseases classified elsewhere: Secondary | ICD-10-CM | POA: Insufficient documentation

## 2019-11-18 DIAGNOSIS — Z01419 Encounter for gynecological examination (general) (routine) without abnormal findings: Secondary | ICD-10-CM | POA: Diagnosis not present

## 2019-11-18 MED ORDER — METRONIDAZOLE 500 MG PO TABS: 500.0000 mg | ORAL_TABLET | Freq: Two times a day (BID) | ORAL | 0 refills | Status: DC

## 2019-11-18 MED ORDER — ACIDOPHILUS LACTOBACILLUS PO CAPS: 1.0000 | ORAL_CAPSULE | Freq: Every day | ORAL | 3 refills | Status: AC | PRN

## 2019-11-18 NOTE — Progress Notes (Signed)
No Sti testing  Mammo-12/16/2018

## 2019-11-18 NOTE — Progress Notes (Addendum)
GYNECOLOGY ANNUAL PREVENTATIVE CARE ENCOUNTER NOTE  History:     Daisy Baker is a 44 y.o. G32P1102 female here for a routine annual gynecologic exam.  Current complaints: intermittent abnormal vaginal discharge which switches between yeast and Bacterial Vaginosis.  She also is concerned that she does not lubricate as quickly during intercourse and is concerned that she needs vaginal Estrogen. Denies abnormal vaginal bleeding, discharge, pelvic pain, problems with intercourse or other gynecologic concerns.    Gynecologic History Patient's last menstrual period was 09/09/2016 (exact date). Contraception: status post hysterectomy Last Pap: 01/24/2016. Results were: abnormal: LSIL with negative HPV Last mammogram: 10/2019. Results were: normal  Obstetric History OB History  Gravida Para Term Preterm AB Living  2 2 1 1   2   SAB TAB Ectopic Multiple Live Births          2    # Outcome Date GA Lbr Len/2nd Weight Sex Delivery Anes PTL Lv  2 Preterm 10/07/14 [redacted]w[redacted]d  3 lb 14 oz (1.758 kg) F CS-LTranv   LIV     Complications: Fetal Intolerance, PROM (premature rupture of membranes)  1 Term 05/20/96 [redacted]w[redacted]d  6 lb 7 oz (2.92 kg) F Vag-Spont   LIV    Past Medical History:  Diagnosis Date  . Allergy   . Anemia   . Asthma   . Headache    Migraines  . PONV (postoperative nausea and vomiting)   . Sleep apnea    does not use CPAP - just picked it up  . SVD (spontaneous vaginal delivery)    x 1  . Vertigo     Past Surgical History:  Procedure Laterality Date  . ABDOMINAL HYSTERECTOMY N/A 09/20/2016   Procedure: HYSTERECTOMY ABDOMINAL;  Surgeon: Emily Filbert, MD;  Location: [facility];  Service: Gynecology;  Laterality: N/A;  . CESAREAN SECTION    . DILITATION & CURRETTAGE/HYSTROSCOPY WITH NOVASURE ABLATION N/A 09/02/2015   Procedure: DILATATION & CURETTAGE WITH ATTEMPTED NOVASURE ABLATION;  Surgeon: Emily Filbert, MD;  Location: [facility];  Service: Gynecology;  Laterality: N/A;  . LAPAROSCOPIC  TUBAL LIGATION Bilateral 09/02/2015   Procedure: LAPAROSCOPIC BILATERAL SALPINGECTOMY, LYSIS OF ADHESIONS;  Surgeon: Emily Filbert, MD;  Location: [facility];  Service: Gynecology;  Laterality: Bilateral;  . MYOMECTOMY  2011  . NORPLANT REMOVAL Left 09/02/2015   Procedure: REMOVAL OF NORPLANT;  Surgeon: Emily Filbert, MD;  Location: [facility];  Service: Gynecology;  Laterality: Left;    Current Outpatient Medications on File Prior to Visit  Medication Sig Dispense Refill  . aspirin-acetaminophen-caffeine (EXCEDRIN MIGRAINE) 250-250-65 MG tablet Take 2 tablets by mouth every 6 (six) hours as needed (for migraine headache.).    Marland Kitchen acetaminophen (TYLENOL) 500 MG tablet Take 500-1,000 mg by mouth every 6 (six) hours as needed (for pain.).  (Patient not taking: Reported on 11/09/2019)    . albuterol (PROVENTIL HFA;VENTOLIN HFA) 108 (90 Base) MCG/ACT inhaler Inhale 2 puffs into the lungs every 6 (six) hours as needed for wheezing or shortness of breath.     . dicyclomine (BENTYL) 10 MG capsule Take 10 mg by mouth 4 (four) times daily as needed (for abdominal spasms/ibs).     . fexofenadine (ALLEGRA) 180 MG tablet Take 180 mg by mouth daily.     . fexofenadine-pseudoephedrine (ALLEGRA-D) 60-120 MG per tablet Take 1 tablet by mouth every 12 (twelve) hours. (Patient not taking: Reported on 11/09/2019) 30 tablet 0  . fluconazole (DIFLUCAN) 150 MG tablet Take 1 tablet (150  mg total) by mouth daily. Repeat in 24 hours if needed 2 tablet 2  . fluticasone (FLONASE) 50 MCG/ACT nasal spray Place 1 spray into the nose daily as needed (for allergies.).     Marland Kitchen ibuprofen (ADVIL,MOTRIN) 600 MG tablet Take 1 tablet (600 mg total) by mouth every 6 (six) hours as needed. (Patient not taking: Reported on 11/09/2019) 30 tablet 1  . metroNIDAZOLE (METROGEL) 0.75 % vaginal gel Place 1 Applicatorful vaginally at bedtime. Apply one applicatorful to vagina at bedtime for 5 days (Patient not taking: Reported on 10/02/2017) 70 g 6  . senna (SENOKOT) 8.6  MG tablet Take by mouth. (Patient not taking: Reported on 11/18/2019)    . terconazole (TERAZOL 3) 0.8 % vaginal cream Place 1 applicator vaginally at bedtime. Apply nightly for three nights. (Patient not taking: Reported on 11/18/2019) 20 g 0  . tetrahydrozoline-zinc (VISINE-AC) 0.05-0.25 % ophthalmic solution Place 2 drops into both eyes 3 (three) times daily as needed (FOR ALLERGY EYES). (Patient not taking: Reported on 11/18/2019)    . traMADol (ULTRAM) 50 MG tablet Take 1 tablet (50 mg total) by mouth every 6 (six) hours as needed. (Patient not taking: Reported on 11/09/2019) 30 tablet 1  . traMADol (ULTRAM) 50 MG tablet Take 1 tablet (50 mg total) by mouth every 6 (six) hours as needed (pain). (Patient not taking: Reported on 10/02/2017) 30 tablet 0   No current facility-administered medications on file prior to visit.    No Known Allergies  Social History:  reports that she has never smoked. She has never used smokeless tobacco. She reports current alcohol use. She reports that she does not use drugs.  Family History  Problem Relation Age of Onset  . Hypertension Mother   . Diabetes Mother   . Asthma Mother   . Hypertension Father   . Diabetes Father     The following portions of the patient's history were reviewed and updated as appropriate: allergies, current medications, past family history, past medical history, past social history, past surgical history and problem list.  Review of Systems Pertinent items noted in HPI and remainder of comprehensive ROS otherwise negative.  Physical Exam:  BP 124/84   Pulse 74   Wt 286 lb (129.7 kg)   LMP 09/09/2016 (Exact Date)   BMI 50.66 kg/m  CONSTITUTIONAL: Well-developed, well-nourished female in no acute distress.  HENT:  Normocephalic, atraumatic, External right and left ear normal. Oropharynx is clear and moist EYES: Conjunctivae and EOM are normal. Pupils are equal, round, and reactive to light. No scleral icterus.  NECK: Normal  range of motion, supple, no masses.  Normal thyroid.  SKIN: Skin is warm and dry. No rash noted. Not diaphoretic. No erythema. No pallor. MUSCULOSKELETAL: Normal range of motion. No tenderness.  No cyanosis, clubbing, or edema.  2+ distal pulses. NEUROLOGIC: Alert and oriented to person, place, and time. Normal reflexes, muscle tone coordination.  PSYCHIATRIC: Normal mood and affect. Normal behavior. Normal judgment and thought content. CARDIOVASCULAR: Normal heart rate noted, regular rhythm RESPIRATORY: Clear to auscultation bilaterally. Effort and breath sounds normal, no problems with respiration noted. BREASTS: Symmetric in size. No masses, tenderness, skin changes, nipple drainage, or lymphadenopathy bilaterally. Performed in the presence of a chaperone. ABDOMEN: Soft, no distention noted.  No tenderness, rebound or guarding.  PELVIC: Normal appearing external genitalia and urethral meatus; normal appearing vaginal mucosa and cervix.  Thin white discharge c/w BV throughout vault.  Pap smear obtained.  Normal uterine size, no other palpable  masses, no uterine or adnexal tenderness.  Performed in the presence of a chaperone.   Assessment and Plan:    1. Well woman exam with routine gynecological exam - Mammogram results not visible in chart. Records request initiated - Well woman lab work accomplished by PCP - Cytology - PAP( [place])  2. Bacterial vaginosis - Advised discontinuation of Vagisil personal cleansing agent - Advised discontinuation of pubic hair clipping, shaving, waxing - Consider daily Probiotic supplement especially while taking Flagyl - metroNIDAZOLE (FLAGYL) 500 MG tablet; Take 1 tablet (500 mg total) by mouth 2 (two) times daily.  Dispense: 14 tablet; Refill: 0 - Cervicovaginal ancillary only( [place])  Will follow up results of pap smear and manage accordingly. Routine preventative health maintenance measures emphasized. Please refer to After Visit Summary for  other counseling recommendations.     Total visit time: 30 minutes. Greater than 50% of visit spent in counseling and coordination of care  Mallie Snooks, MSN, CNM Certified Nurse Midwife, [employer] for [employer], [facility] [place] [facility] 11/18/19 1:38 PM

## 2019-11-18 NOTE — Patient Instructions (Signed)
Preventive Care 40-42 Years Old, Female °Preventive care refers to visits with your health care provider and lifestyle choices that can promote health and wellness. This includes: °· A yearly physical exam. This may also be called an annual well check. °· Regular dental visits and eye exams. °· Immunizations. °· Screening for certain conditions. °· Healthy lifestyle choices, such as eating a healthy diet, getting regular exercise, not using drugs or products that contain nicotine and tobacco, and limiting alcohol use. °What can I expect for my preventive care visit? °Physical exam °Your health care provider will check your: °· Height and weight. This may be used to calculate body mass index (BMI), which tells if you are at a healthy weight. °· Heart rate and blood pressure. °· Skin for abnormal spots. °Counseling °Your health care provider may ask you questions about your: °· Alcohol, tobacco, and drug use. °· Emotional well-being. °· Home and relationship well-being. °· Sexual activity. °· Eating habits. °· Work and work environment. °· Method of birth control. °· Menstrual cycle. °· Pregnancy history. °What immunizations do I need? ° °Influenza (flu) vaccine °· This is recommended every year. °Tetanus, diphtheria, and pertussis (Tdap) vaccine °· You may need a Td booster every 10 years. °Varicella (chickenpox) vaccine °· You may need this if you have not been vaccinated. °Zoster (shingles) vaccine °· You may need this after age 60. °Measles, mumps, and rubella (MMR) vaccine °· You may need at least one dose of MMR if you were born in 1957 or later. You may also need a second dose. °Pneumococcal conjugate (PCV13) vaccine °· You may need this if you have certain conditions and were not previously vaccinated. °Pneumococcal polysaccharide (PPSV23) vaccine °· You may need one or two doses if you smoke cigarettes or if you have certain conditions. °Meningococcal conjugate (MenACWY) vaccine °· You may need this if you  have certain conditions. °Hepatitis A vaccine °· You may need this if you have certain conditions or if you travel or work in places where you may be exposed to hepatitis A. °Hepatitis B vaccine °· You may need this if you have certain conditions or if you travel or work in places where you may be exposed to hepatitis B. °Haemophilus influenzae type b (Hib) vaccine °· You may need this if you have certain conditions. °Human papillomavirus (HPV) vaccine °· If recommended by your health care provider, you may need three doses over 6 months. °You may receive vaccines as individual doses or as more than one vaccine together in one shot (combination vaccines). Talk with your health care provider about the risks and benefits of combination vaccines. °What tests do I need? °Blood tests °· Lipid and cholesterol levels. These may be checked every 5 years, or more frequently if you are over 50 years old. °· Hepatitis C test. °· Hepatitis B test. °Screening °· Lung cancer screening. You may have this screening every year starting at age 55 if you have a 30-pack-year history of smoking and currently smoke or have quit within the past 15 years. °· Colorectal cancer screening. All adults should have this screening starting at age 50 and continuing until age 75. Your health care provider may recommend screening at age 45 if you are at increased risk. You will have tests every 1-10 years, depending on your results and the type of screening test. °· Diabetes screening. This is done by checking your blood sugar (glucose) after you have not eaten for a while (fasting). You may have this   done every 1-3 years.  Mammogram. This may be done every 1-2 years. Talk with your health care provider about when you should start having regular mammograms. This may depend on whether you have a family history of breast cancer.  BRCA-related cancer screening. This may be done if you have a family history of breast, ovarian, tubal, or peritoneal  cancers.  Pelvic exam and Pap test. This may be done every 3 years starting at age 76. Starting at age 89, this may be done every 5 years if you have a Pap test in combination with an HPV test. Other tests  Sexually transmitted disease (STD) testing.  Bone density scan. This is done to screen for osteoporosis. You may have this scan if you are at high risk for osteoporosis. Follow these instructions at home: Eating and drinking  Eat a diet that includes fresh fruits and vegetables, whole grains, lean protein, and low-fat dairy.  Take vitamin and mineral supplements as recommended by your health care provider.  Do not drink alcohol if: ? Your health care provider tells you not to drink. ? You are pregnant, may be pregnant, or are planning to become pregnant.  If you drink alcohol: ? Limit how much you have to 0-1 drink a day. ? Be aware of how much alcohol is in your drink. In the [place] one drink equals one 12 oz bottle of beer (355 mL), one 5 oz glass of wine (148 mL), or one 1 oz glass of hard liquor (44 mL). Lifestyle  Take daily care of your teeth and gums.  Stay active. Exercise for at least 30 minutes on 5 or more days each week.  Do not use any products that contain nicotine or tobacco, such as cigarettes, e-cigarettes, and chewing tobacco. If you need help quitting, ask your health care provider.  If you are sexually active, practice safe sex. Use a condom or other form of birth control (contraception) in order to prevent pregnancy and STIs (sexually transmitted infections).  If told by your health care provider, take low-dose aspirin daily starting at age 37. What's next?  Visit your health care provider once a year for a well check visit.  Ask your health care provider how often you should have your eyes and teeth checked.  Stay up to date on all vaccines. This information is not intended to replace advice given to you by your health care provider. Make sure you  discuss any questions you have with your health care provider. Document Revised: 11/28/2017 Document Reviewed: 11/28/2017 Elsevier Patient Education  2020 [employer].

## 2019-11-19 LAB — CERVICOVAGINAL ANCILLARY ONLY
Bacterial Vaginitis (gardnerella): NEGATIVE
Candida Glabrata: NEGATIVE
Candida Vaginitis: NEGATIVE
Comment: NEGATIVE
Comment: NEGATIVE
Comment: NEGATIVE

## 2019-11-19 LAB — CYTOLOGY - PAP
Comment: NEGATIVE
Diagnosis: NEGATIVE
High risk HPV: NEGATIVE

## 2019-12-15 ENCOUNTER — Ambulatory Visit (INDEPENDENT_AMBULATORY_CARE_PROVIDER_SITE_OTHER): Payer: BC Managed Care – PPO

## 2019-12-15 ENCOUNTER — Other Ambulatory Visit: Payer: Self-pay

## 2019-12-15 ENCOUNTER — Other Ambulatory Visit (HOSPITAL_COMMUNITY)
Admission: RE | Admit: 2019-12-15 | Discharge: 2019-12-15 | Disposition: A | Discharge status: Home / Self Care | Payer: BC Managed Care – PPO | Source: Ambulatory Visit | Attending: Obstetrics & Gynecology | Admitting: Obstetrics & Gynecology

## 2019-12-15 VITALS — BP 134/69 | HR 83

## 2019-12-15 DIAGNOSIS — B379 Candidiasis, unspecified: Secondary | ICD-10-CM

## 2019-12-15 DIAGNOSIS — N898 Other specified noninflammatory disorders of vagina: Secondary | ICD-10-CM | POA: Insufficient documentation

## 2019-12-15 DIAGNOSIS — Z23 Encounter for immunization: Secondary | ICD-10-CM | POA: Diagnosis not present

## 2019-12-15 DIAGNOSIS — N760 Acute vaginitis: Secondary | ICD-10-CM

## 2019-12-15 NOTE — Progress Notes (Addendum)
SUBJECTIVE:  44 y.o. female complains of white vaginal discharge for 3 day(s). Denies abnormal vaginal bleeding or significant pelvic pain or fever. No UTI symptoms. Denies history of known exposure to STD.  Patient's last menstrual period was 09/09/2016 (exact date).  OBJECTIVE:  She appears well, afebrile. Urine dipstick: not done.  ASSESSMENT:  Vaginal Discharge: Yes Vaginal Odor: Yes   PLAN:  GC, chlamydia, trichomonas, BVAG, CVAG probe sent to lab. Treatment: To be determined once lab results are received ROV prn if symptoms persist or worsen.

## 2019-12-15 NOTE — Addendum Note (Signed)
Addended by: Huey Bienenstock on: 12/15/2019 08:34 AM   Modules accepted: Orders

## 2019-12-16 LAB — CERVICOVAGINAL ANCILLARY ONLY
Bacterial Vaginitis (gardnerella): NEGATIVE
Candida Glabrata: POSITIVE — AB
Candida Vaginitis: POSITIVE — AB
Chlamydia: NEGATIVE
Comment: NEGATIVE
Comment: NEGATIVE
Comment: NEGATIVE
Comment: NEGATIVE
Comment: NEGATIVE
Comment: NORMAL
Neisseria Gonorrhea: NEGATIVE
Trichomonas: NEGATIVE

## 2019-12-17 ENCOUNTER — Telehealth: Payer: Self-pay | Admitting: *Deleted

## 2019-12-17 MED ORDER — BORIC ACID CRYS: 600.0000 mg | CRYSTALS | Freq: Every day | 2 refills | Status: AC

## 2019-12-17 MED ORDER — FLUCONAZOLE 150 MG PO TABS: 150.0000 mg | ORAL_TABLET | Freq: Once | ORAL | 3 refills | Status: AC

## 2019-12-17 NOTE — Addendum Note (Signed)
Addended by: Verita Schneiders A on: 12/17/2019 02:47 PM   Modules accepted: Orders

## 2019-12-17 NOTE — Telephone Encounter (Signed)
-----   Message from Osborne Oman, MD sent at 12/17/2019  2:47 PM EDT ----- Vaginal discharge test is abnormal and showed yeast vaginitis but with the C. Glabrata species. This can be resistant to Diflucan, so boric acid therapy also prescribed. This has to be compounded and was sent to [facility], both medications sent there.  Please inform patient of results, advise to pick up prescribed medications and take as directed.

## 2019-12-17 NOTE — Telephone Encounter (Signed)
Called pt and went over results and medications.

## 2020-08-01 ENCOUNTER — Ambulatory Visit (INDEPENDENT_AMBULATORY_CARE_PROVIDER_SITE_OTHER): Payer: Self-pay | Admitting: Family Medicine

## 2020-08-01 ENCOUNTER — Other Ambulatory Visit (HOSPITAL_COMMUNITY)
Admission: RE | Admit: 2020-08-01 | Discharge: 2020-08-01 | Disposition: A | Discharge status: Home / Self Care | Payer: Medicaid Other | Source: Ambulatory Visit | Attending: Family Medicine | Admitting: Family Medicine

## 2020-08-01 ENCOUNTER — Encounter: Payer: Self-pay | Admitting: Family Medicine

## 2020-08-01 ENCOUNTER — Other Ambulatory Visit: Payer: Self-pay

## 2020-08-01 VITALS — BP 133/85 | HR 105 | Ht 63.0 in | Wt 280.2 lb

## 2020-08-01 DIAGNOSIS — B3731 Acute candidiasis of vulva and vagina: Secondary | ICD-10-CM

## 2020-08-01 DIAGNOSIS — Z113 Encounter for screening for infections with a predominantly sexual mode of transmission: Secondary | ICD-10-CM | POA: Diagnosis present

## 2020-08-01 DIAGNOSIS — N761 Subacute and chronic vaginitis: Secondary | ICD-10-CM

## 2020-08-01 DIAGNOSIS — B373 Candidiasis of vulva and vagina: Secondary | ICD-10-CM

## 2020-08-01 MED ORDER — FLUCONAZOLE 150 MG PO TABS
150.0000 mg | ORAL_TABLET | Freq: Once | ORAL | 3 refills | Status: DC
Start: 2020-08-01 — End: 2021-02-16

## 2020-08-01 MED ORDER — METRONIDAZOLE 0.75 % VA GEL: 1.0000 | Freq: Every day | VAGINAL | 5 refills | Status: DC

## 2020-08-01 NOTE — Patient Instructions (Addendum)
Get probiotics - Culturelle (name brand)- has lactobacillius - Eat Yogurt (activia), Kefir, or Kombucha (fermented tea) - Fermented cabbages- saurkraut, kim chi, chow chow   Natural Remedies for Bacterial Vaginosis  Option #1 1 Tbsp Fractitionated Coconut Oil 10 drops of Melaleuca (Tea Tree) Oil  Mix ingredients together well.  Soak 3-4 tampons (in applicators) in that mixture until all or mostly all mixture is soaked up into the tampons.  Insert 1 saturated tampon vaginally and wear overnight for 3-4 nights.    Option #2 (sometimes to be used in conjunction with option #1) Fill tub with enough to cover lap/lower abdomen warm water.  Mix 1/2 cup of baking soda in water.  Soak in water/baking soda mixture for at least 20 minutes.  Be sure to swish water in between legs to get as much in vagina as possible.  This soak should be done after sexual intercourse and menstrual cycles.     Option #3 (sometimes to be used in conjunction with option #1 and 2) Fill tub with enough to cover lap/lower abdomen warm water.  Mix 2-4 cup of apple cider vinegar in water.  Soak in water/vinegar mixture for at least 20 minutes.  Be sure to swish water in between legs to get as much in vagina as possible.  This soak should be done after sexual intercourse and menstrual cycles.    GO WHITE: Soap: UNSCENTED Dove (white box light green writing) Laundry detergent (underwear)- Dreft or [employer] unscented WHITE 100% cotton panties (NOT just cotton crouch) Sanitary napkin/panty liners: UNSCENTED.  If it doesn't SAY unscented it can have a scent/perfume    NO PERFUMES OR LOTIONS OR POTIONS in the vulvar area (may use regular [place]) Condoms: hypoallergenic only. Non dyed (no color) Toilet papers: white only Wash clothes: use a separate wash cloth. WHITE.  Wash in [place].   You can purchase Tea Tree Oil locally at:  [employer] [address] [place] [address]  [facility] [employer] [address] [place] [address]  Advise that these alternatives will not replace the need to be evaluated if symptoms persist. You will need to seek care at an OB/GYN provider.

## 2020-08-01 NOTE — Progress Notes (Signed)
H  GYNECOLOGY PROBLEM  VISIT ENCOUNTER NOTE  Subjective:   Daisy Baker is a 45 y.o. G88P1102 female here for a a problem GYN visit.  Current complaints: vaginal irriration and recurrent BV and yeast infections. She is s/p hysterectomy and report chronic vaginal complaints. Seen by CNM prior  Had recent yeast infection and treated with Boric acid due to candida galbrata species being present.   She also does the following:  Using vagisil wash Using vinegar to wash her vagina  Reports she goes between BV and yeast-- wants to know why this is happening Also reports vaginal dryness.   Recently resumed sexual activity with a new partner.  She reports a rash on pubic area due to recent waxing event-- reports her skin felt like it was burning.    Denies abnormal vaginal bleeding, discharge, pelvic pain, problems with intercourse or other gynecologic concerns.    Gynecologic History Patient's last menstrual period was 09/09/2016 (exact date). Contraception: status post hysterectomy  Health Maintenance Due  Topic Date Due  . COVID-19 Vaccine (1) Never done     The following portions of the patient's history were reviewed and updated as appropriate: allergies, current medications, past family history, past medical history, past social history, past surgical history and problem list.  Review of Systems Pertinent items are noted in HPI.   Objective:  BP 133/85   Pulse (!) 105   Ht 5\' 3"  (1.6 m)   Wt 280 lb 3.2 oz (127.1 kg)   LMP 09/09/2016 (Exact Date)   BMI 49.64 kg/m  Gen: well appearing, NAD HEENT: no scleral icterus CV: RR Lung: Normal WOB Ext: warm well perfused  PELVIC: Normal appearing external genitalia- unable to see the rash the patient was concerned about. She has hair growth on mons and labia; normal appearing vaginal mucosa and cervix.  Creamy but scant discharge. No odor on my exam. Surgically absent uterus. No other palpable masses, no uterine or adnexal  tenderness.   Assessment and Plan:  1. Screening examination for venereal disease - Cervicovaginal ancillary only - HIV Antibody (routine testing w rflx) - RPR - Hepatitis C antibody  2. Yeast infection involving the vagina and surrounding area - Hemoglobin A1c - fluconazole (DIFLUCAN) 150 MG tablet; Take 1 tablet (150 mg total) by mouth once for 1 dose. Can take additional dose three days later if symptoms persist  Dispense: 1 tablet; Refill: 3  3. Chronic vaginitis Will trial dual treatment with continuous gel usage Also instructed on natural remedies and given these in patient instructions - metroNIDAZOLE (METROGEL) 0.75 % vaginal gel; Place 1 Applicatorful vaginally at bedtime. Apply one applicatorful to vagina at bedtime for 10 days, then twice a week for 6 months.  Dispense: 70 g; Refill: 5   Please refer to After Visit Summary for other counseling recommendations.   Return in about 3 months (around 11/01/2020) for recurrent BV.  Caren Macadam, MD, MPH, ABFM Attending [place] for [facility]

## 2020-08-02 LAB — HEMOGLOBIN A1C
Est. average glucose Bld gHb Est-mCnc: 157 mg/dL
Hgb A1c MFr Bld: 7.1 % — ABNORMAL HIGH (ref 4.8–5.6)

## 2020-08-03 LAB — CERVICOVAGINAL ANCILLARY ONLY
Bacterial Vaginitis (gardnerella): POSITIVE — AB
Candida Glabrata: POSITIVE — AB
Candida Vaginitis: NEGATIVE
Chlamydia: NEGATIVE
Comment: NEGATIVE
Comment: NEGATIVE
Comment: NEGATIVE
Comment: NEGATIVE
Comment: NEGATIVE
Comment: NORMAL
Neisseria Gonorrhea: NEGATIVE
Trichomonas: NEGATIVE

## 2020-08-03 LAB — RPR: RPR Ser Ql: NONREACTIVE

## 2020-08-03 LAB — HIV ANTIBODY (ROUTINE TESTING W REFLEX): HIV Screen 4th Generation wRfx: NONREACTIVE

## 2020-08-03 LAB — HEPATITIS C ANTIBODY: Hep C Virus Ab: <0.1 s/co ratio (ref 0.0–0.9)

## 2020-11-23 ENCOUNTER — Ambulatory Visit: Payer: Medicaid Other | Admitting: Family Medicine

## 2020-11-24 ENCOUNTER — Encounter: Payer: Self-pay | Admitting: Family Medicine

## 2020-11-24 ENCOUNTER — Ambulatory Visit (INDEPENDENT_AMBULATORY_CARE_PROVIDER_SITE_OTHER): Payer: Self-pay | Admitting: Family Medicine

## 2020-11-24 ENCOUNTER — Other Ambulatory Visit: Payer: Self-pay

## 2020-11-24 ENCOUNTER — Encounter: Payer: Self-pay | Admitting: Physician Assistant

## 2020-11-24 ENCOUNTER — Other Ambulatory Visit (HOSPITAL_COMMUNITY)
Admission: RE | Admit: 2020-11-24 | Discharge: 2020-11-24 | Disposition: A | Discharge status: Home / Self Care | Payer: Medicaid Other | Source: Ambulatory Visit | Attending: Family Medicine | Admitting: Family Medicine

## 2020-11-24 VITALS — BP 110/76 | HR 82 | Ht 63.0 in | Wt 290.4 lb

## 2020-11-24 DIAGNOSIS — Z113 Encounter for screening for infections with a predominantly sexual mode of transmission: Secondary | ICD-10-CM

## 2020-11-24 DIAGNOSIS — N898 Other specified noninflammatory disorders of vagina: Secondary | ICD-10-CM | POA: Insufficient documentation

## 2020-11-24 DIAGNOSIS — Z90710 Acquired absence of both cervix and uterus: Secondary | ICD-10-CM

## 2020-11-24 DIAGNOSIS — Z01419 Encounter for gynecological examination (general) (routine) without abnormal findings: Secondary | ICD-10-CM

## 2020-11-24 NOTE — Patient Instructions (Signed)
Preventive Care 21-43 Years Old, Female Preventive care refers to lifestyle choices and visits with your health care provider that can promote health and wellness. This includes: A yearly physical exam. This is also called an annual wellness visit. Regular dental and eye exams. Immunizations. Screening for certain conditions. Healthy lifestyle choices, such as: Eating a healthy diet. Getting regular exercise. Not using drugs or products that contain nicotine and tobacco. Limiting alcohol use. What can I expect for my preventive care visit? Physical exam Your health care provider may check your: Height and weight. These may be used to calculate your BMI (body mass index). BMI is a measurement that tells if you are at a healthy weight. Heart rate and blood pressure. Body temperature. Skin for abnormal spots. Counseling Your health care provider may ask you questions about your: Past medical problems. Family's medical history. Alcohol, tobacco, and drug use. Emotional well-being. Home life and relationship well-being. Sexual activity. Diet, exercise, and sleep habits. Work and work environment. Access to firearms. Method of birth control. Menstrual cycle. Pregnancy history. What immunizations do I need?  Vaccines are usually given at various ages, according to a schedule. Your health care provider will recommend vaccines for you based on your age, medicalhistory, and lifestyle or other factors, such as travel or where you work. What tests do I need?  Blood tests Lipid and cholesterol levels. These may be checked every 5 years starting at age 20. Hepatitis C test. Hepatitis B test. Screening Diabetes screening. This is done by checking your blood sugar (glucose) after you have not eaten for a while (fasting). STD (sexually transmitted disease) testing, if you are at risk. BRCA-related cancer screening. This may be done if you have a family history of breast, ovarian, tubal, or  peritoneal cancers. Pelvic exam and Pap test. This may be done every 3 years starting at age 21. Starting at age 30, this may be done every 5 years if you have a Pap test in combination with an HPV test. Talk with your health care provider about your test results, treatment options,and if necessary, the need for more tests. Follow these instructions at home: Eating and drinking  Eat a healthy diet that includes fresh fruits and vegetables, whole grains, lean protein, and low-fat dairy products. Take vitamin and mineral supplements as recommended by your health care provider. Do not drink alcohol if: Your health care provider tells you not to drink. You are pregnant, may be pregnant, or are planning to become pregnant. If you drink alcohol: Limit how much you have to 0-1 drink a day. Be aware of how much alcohol is in your drink. In the [place] one drink equals one 12 oz bottle of beer (355 mL), one 5 oz glass of wine (148 mL), or one 1 oz glass of hard liquor (44 mL).  Lifestyle Take daily care of your teeth and gums. Brush your teeth every morning and night with fluoride toothpaste. Floss one time each day. Stay active. Exercise for at least 30 minutes 5 or more days each week. Do not use any products that contain nicotine or tobacco, such as cigarettes, e-cigarettes, and chewing tobacco. If you need help quitting, ask your health care provider. Do not use drugs. If you are sexually active, practice safe sex. Use a condom or other form of protection to prevent STIs (sexually transmitted infections). If you do not wish to become pregnant, use a form of birth control. If you plan to become pregnant, see your health care   provider for a prepregnancy visit. Find healthy ways to cope with stress, such as: Meditation, yoga, or listening to music. Journaling. Talking to a trusted person. Spending time with friends and family. Safety Always wear your seat belt while driving or riding in a  vehicle. Do not drive: If you have been drinking alcohol. Do not ride with someone who has been drinking. When you are tired or distracted. While texting. Wear a helmet and other protective equipment during sports activities. If you have firearms in your house, make sure you follow all gun safety procedures. Seek help if you have been physically or sexually abused. What's next? Go to your health care provider once a year for an annual wellness visit. Ask your health care provider how often you should have your eyes and teeth checked. Stay up to date on all vaccines. This information is not intended to replace advice given to you by your health care provider. Make sure you discuss any questions you have with your healthcare provider. Document Revised: 11/15/2019 Document Reviewed: 11/28/2017 Elsevier Patient Education  2022 [employer].

## 2020-11-24 NOTE — Progress Notes (Signed)
Erroneous Encounter

## 2020-11-24 NOTE — Progress Notes (Signed)
Pt presents for annual and all STD testing. Pt c/o malodorous vaginal discharge x 1 week.  Normal mammogram 10/29/2019

## 2020-11-24 NOTE — Progress Notes (Addendum)
Subjective:     Daisy Baker is a 45 y.o. female and is here for a comprehensive physical exam. The patient reports  malodorous vaginal discharge and itching for the past week . No visible discharge, but occasionally has itching that is relieved by washing. She has no dysuria or pelvic pain.    The following portions of the patient's history were reviewed and updated as appropriate: allergies, current medications, past family history, past medical history, past social history, past surgical history, and problem list.  Review of Systems Pertinent items noted in HPI and remainder of comprehensive ROS otherwise negative.   Objective:    BP 110/76   Pulse 82   Ht '5\' 3"'$  (1.6 m)   Wt 290 lb 6.4 oz (131.7 kg)   LMP 09/09/2016 (Exact Date)   BMI 51.44 kg/m  General appearance: alert, cooperative, and no distress Head: Normocephalic, without obvious abnormality, atraumatic Eyes: conjunctivae/corneas clear. PERRL, EOM's intact. Fundi benign. Lungs: clear to auscultation bilaterally Breasts: normal appearance, no masses or tenderness Heart: regular rate and rhythm Abdomen: soft, non-tender; bowel sounds normal; no masses,  no organomegaly Pelvic: external genitalia normal, perianal skin: no external genital warts noted, and vagina normal without discharge Extremities: extremities normal, atraumatic, no cyanosis or edema Pulses: 2+ and symmetric Skin: Skin color, texture, turgor normal. No rashes or lesions Neurologic: Grossly normal    Assessment:    Healthy female exam. Possible causes of malodorous vaginal discharge include bacterial vaginosis, trichomoniasis, candida vaginitis, and candida glabrata.     Plan:   Vaginal Discharge - Cervicovaginal ancillary only - Continue Metronidazole 0.75% vaginal gel  2. Screen for STD - RPR - HIV antibody - Hep C antibody - Hep B surface antigen  Hypertension - Continue Amlodipine '5mg'$  - Continue Hydrochlorothiazide '25mg'$    See After  Visit Summary for Counseling Recommendations   Nelva Nay, Medical Student

## 2020-11-25 LAB — RPR: RPR Ser Ql: NONREACTIVE

## 2020-11-25 LAB — HEPATITIS B SURFACE ANTIGEN: Hepatitis B Surface Ag: NEGATIVE

## 2020-11-25 LAB — HEPATITIS C ANTIBODY: Hep C Virus Ab: <0.1 s/co ratio (ref 0.0–0.9)

## 2020-11-25 LAB — HIV ANTIBODY (ROUTINE TESTING W REFLEX): HIV Screen 4th Generation wRfx: NONREACTIVE

## 2020-11-28 LAB — CERVICOVAGINAL ANCILLARY ONLY
Bacterial Vaginitis (gardnerella): NEGATIVE
Candida Glabrata: NEGATIVE
Candida Vaginitis: NEGATIVE
Chlamydia: NEGATIVE
Comment: NEGATIVE
Comment: NEGATIVE
Comment: NEGATIVE
Comment: NEGATIVE
Comment: NEGATIVE
Comment: NORMAL
Neisseria Gonorrhea: NEGATIVE
Trichomonas: NEGATIVE

## 2021-02-09 ENCOUNTER — Other Ambulatory Visit: Payer: Self-pay | Admitting: Family Medicine

## 2021-02-09 DIAGNOSIS — B3731 Acute candidiasis of vulva and vagina: Secondary | ICD-10-CM

## 2021-03-30 ENCOUNTER — Other Ambulatory Visit (HOSPITAL_COMMUNITY)
Admission: RE | Admit: 2021-03-30 | Discharge: 2021-03-30 | Disposition: A | Discharge status: Home / Self Care | Payer: BC Managed Care – PPO | Source: Ambulatory Visit | Attending: Physician Assistant | Admitting: Physician Assistant

## 2021-03-30 ENCOUNTER — Ambulatory Visit (INDEPENDENT_AMBULATORY_CARE_PROVIDER_SITE_OTHER): Payer: BC Managed Care – PPO | Admitting: Advanced Practice Midwife

## 2021-03-30 ENCOUNTER — Other Ambulatory Visit: Payer: Self-pay

## 2021-03-30 VITALS — BP 138/81 | HR 92 | Wt 288.6 lb

## 2021-03-30 DIAGNOSIS — Z87440 Personal history of urinary (tract) infections: Secondary | ICD-10-CM | POA: Diagnosis not present

## 2021-03-30 DIAGNOSIS — R109 Unspecified abdominal pain: Secondary | ICD-10-CM

## 2021-03-30 NOTE — Progress Notes (Signed)
° °  GYNECOLOGY PROBLEM  VISIT ENCOUNTER NOTE  Subjective:   Daisy Baker is a 45 y.o. G50P1102 female here for a problem GYN visit.  Current complaints: right mid abdominal pain which radiates to her suprapubic region. Patient is concerned that she may have a urinary tract infection. She also c/o intermittent abnormal vaginal discharge.   Denies abnormal vaginal bleeding, discharge, pelvic pain, problems with intercourse or other gynecologic concerns.    Patient states she was treated for a UTI based on reported symptoms last fall. At the time she experienced decreased urine output. She states she was told she had not provided enough of a sample to send a culture. She completed the prescribed course of Macrobid and her symptoms resolved. Gynecologic History Patient's last menstrual period was 09/09/2016 (exact date).  Contraception: status post hysterectomy  Health Maintenance Due  Topic Date Due   COVID-19 Vaccine (1) Never done   Pneumococcal Vaccine 55-58 Years old (1 - PCV) Never done   URINE MICROALBUMIN  Never done   INFLUENZA VACCINE  10/31/2020    The following portions of the patient's history were reviewed and updated as appropriate: allergies, current medications, past family history, past medical history, past social history, past surgical history and problem list.  Review of Systems A comprehensive review of systems was negative.   Objective:  BP 138/81    Pulse 92    Wt 288 lb 9.6 oz (130.9 kg)    LMP 09/09/2016 (Exact Date)    BMI 51.12 kg/m  Gen: well appearing, NAD HEENT: no scleral icterus CV: RR Lung: Normal WOB Ext: warm well perfused  PELVIC: Deferred   Assessment and Plan:  1. Right lateral abdominal pain - Identified origin of pain is near right iliac crest - Discussed with patient this is not typical presentation for UTI - Pertinent negatives: dysuria, CVAT, fever - Cervicovaginal ancillary only( [place]) - POCT Urinalysis Dipstick - Urine  Culture  2. Hx: UTI (urinary tract infection)   Please refer to After Visit Summary for other counseling recommendations.    Mallie Snooks, [place], MSN, CNM Certified Nurse Midwife, Product/process development scientist for [employer], [place]

## 2021-04-02 LAB — URINE CULTURE

## 2021-04-04 LAB — CERVICOVAGINAL ANCILLARY ONLY
Bacterial Vaginitis (gardnerella): NEGATIVE
Candida Glabrata: NEGATIVE
Candida Vaginitis: NEGATIVE
Comment: NEGATIVE
Comment: NEGATIVE
Comment: NEGATIVE

## 2021-04-17 ENCOUNTER — Encounter: Payer: Self-pay | Admitting: Advanced Practice Midwife

## 2021-05-10 ENCOUNTER — Other Ambulatory Visit: Payer: Self-pay

## 2021-05-10 ENCOUNTER — Ambulatory Visit (INDEPENDENT_AMBULATORY_CARE_PROVIDER_SITE_OTHER): Payer: BC Managed Care – PPO | Admitting: Obstetrics & Gynecology

## 2021-05-10 ENCOUNTER — Encounter: Payer: Self-pay | Admitting: Obstetrics & Gynecology

## 2021-05-10 VITALS — BP 138/78 | HR 77 | Ht 63.0 in | Wt 291.0 lb

## 2021-05-10 DIAGNOSIS — N644 Mastodynia: Secondary | ICD-10-CM

## 2021-05-10 MED ORDER — DICLOFENAC SODIUM 1 % EX GEL: CUTANEOUS | 2 refills | Status: AC

## 2021-05-10 NOTE — Progress Notes (Signed)
GYNECOLOGY OFFICE VISIT NOTE  History:   Daisy Baker is a 46 y.o. [id number] here today for evaluation of bilateral nipple pain for over a month. Had benign mammogram in 03/2021.  Pain is constant, no skin changes, no redness, no breast masses, no discharge. She is s/p TAH for benign indications. She denies any abnormal vaginal discharge, bleeding, pelvic pain or other concerns.    Past Medical History:  Diagnosis Date   Allergy    Anemia    Asthma    Headache    Migraines   PCOS (polycystic ovarian syndrome)    PONV (postoperative nausea and vomiting)    Sleep apnea    does not use CPAP - just picked it up   SVD (spontaneous vaginal delivery)    x 1   Vertigo     Past Surgical History:  Procedure Laterality Date   ABDOMINAL HYSTERECTOMY N/A 09/20/2016   Procedure: HYSTERECTOMY ABDOMINAL;  Surgeon: Emily Filbert, MD;  Location: [facility];  Service: Gynecology;  Laterality: N/A;   CESAREAN SECTION     DILITATION & CURRETTAGE/HYSTROSCOPY WITH NOVASURE ABLATION N/A 09/02/2015   Procedure: DILATATION & CURETTAGE WITH ATTEMPTED NOVASURE ABLATION;  Surgeon: Emily Filbert, MD;  Location: [facility];  Service: Gynecology;  Laterality: N/A;   LAPAROSCOPIC TUBAL LIGATION Bilateral 09/02/2015   Procedure: LAPAROSCOPIC BILATERAL SALPINGECTOMY, LYSIS OF ADHESIONS;  Surgeon: Emily Filbert, MD;  Location: [facility];  Service: Gynecology;  Laterality: Bilateral;   MYOMECTOMY  2011   NORPLANT REMOVAL Left 09/02/2015   Procedure: REMOVAL OF NORPLANT;  Surgeon: Emily Filbert, MD;  Location: [facility];  Service: Gynecology;  Laterality: Left;    The following portions of the patient's history were reviewed and updated as appropriate: allergies, current medications, past family history, past medical history, past social history, past surgical history and problem list.   Health Maintenance:  Normal mammogram on 11/25/2020 [facility].   Review of Systems:  Pertinent items noted in HPI and remainder of comprehensive ROS  otherwise negative.  Physical Exam:  BP 138/78    Pulse 77    Ht 5\' 3"  (1.6 m)    Wt 291 lb (132 kg)    LMP 09/09/2016 (Exact Date)    BMI 51.55 kg/m  CONSTITUTIONAL: Well-developed, well-nourished female in no acute distress.  SKIN: Skin is warm and dry. No rash noted. Not diaphoretic. No erythema. No pallor. CARDIOVASCULAR: Normal heart rate noted, regular rhythm RESPIRATORY: Effort and breath sounds normal, no problems with respiration noted. BREASTS: Symmetric in size. No masses, tenderness, skin changes, nipple drainage, or lymphadenopathy bilaterally. ABDOMEN: Soft, obese, no distention noted.     Assessment and Plan:    1. Bilateral nipple pain Unsure etiology, could be hormonal (perimenopausal) but need to evaluate for more concerning etiologies. Breast imaging ordered, also ordered referral to Breast Clinic (may need nipple cell scraping to evaluate for Paget's etc). In the meantime, recommended Diclofenac topical gel as needed.  - diclofenac Sodium (VOLTAREN) 1 % GEL; Apply four times a day as needed for pain  Dispense: 50 g; Refill: 2 - US BREAST LTD UNI LEFT INC AXILLA; Future - US BREAST LTD UNI RIGHT INC AXILLA; Future - Ambulatory referral to Breast Clinic - MM DIAG BREAST TOMO BILATERAL; Future  Routine preventative health maintenance measures emphasized. Please refer to After Visit Summary for other counseling recommendations.   Return for any gynecologic concerns.    I spent 20 minutes dedicated to the care of this patient including  pre-visit review of records, face to face time with the patient discussing her conditions and treatments and post visit orders.    Verita Schneiders, MD, [place] for [employer], [place]

## 2021-05-17 ENCOUNTER — Encounter: Payer: Self-pay | Admitting: *Deleted

## 2021-05-26 ENCOUNTER — Ambulatory Visit
Admission: RE | Admit: 2021-05-26 | Discharge: 2021-05-26 | Disposition: A | Discharge status: Home / Self Care | Payer: BC Managed Care – PPO | Source: Ambulatory Visit | Attending: Obstetrics & Gynecology | Admitting: Obstetrics & Gynecology

## 2021-05-26 ENCOUNTER — Ambulatory Visit: Payer: BC Managed Care – PPO

## 2021-05-26 DIAGNOSIS — N644 Mastodynia: Secondary | ICD-10-CM

## 2021-06-01 ENCOUNTER — Other Ambulatory Visit: Payer: Self-pay | Admitting: *Deleted

## 2021-06-01 DIAGNOSIS — N761 Subacute and chronic vaginitis: Secondary | ICD-10-CM

## 2021-06-01 DIAGNOSIS — B3731 Acute candidiasis of vulva and vagina: Secondary | ICD-10-CM

## 2021-06-01 MED ORDER — FLUCONAZOLE 150 MG PO TABS: ORAL_TABLET | ORAL | 3 refills | Status: DC

## 2021-06-01 MED ORDER — METRONIDAZOLE 0.75 % VA GEL: 1.0000 | Freq: Every day | VAGINAL | 1 refills | Status: DC

## 2021-09-05 ENCOUNTER — Other Ambulatory Visit (HOSPITAL_COMMUNITY)
Admission: RE | Admit: 2021-09-05 | Discharge: 2021-09-05 | Disposition: A | Discharge status: Home / Self Care | Payer: BC Managed Care – PPO | Source: Ambulatory Visit | Attending: Obstetrics and Gynecology | Admitting: Obstetrics and Gynecology

## 2021-09-05 ENCOUNTER — Ambulatory Visit (INDEPENDENT_AMBULATORY_CARE_PROVIDER_SITE_OTHER): Payer: BC Managed Care – PPO | Admitting: *Deleted

## 2021-09-05 DIAGNOSIS — Z113 Encounter for screening for infections with a predominantly sexual mode of transmission: Secondary | ICD-10-CM | POA: Insufficient documentation

## 2021-09-05 NOTE — Progress Notes (Cosign Needed)
SUBJECTIVE:  46 y.o. female who desires a STI screen. Denies abnormal vaginal discharge, bleeding or significant pelvic pain. No UTI symptoms.  Previous partner was positive for HSV and trichomonas. Pt wants to be tested before being treated. Patient's last menstrual period was 09/09/2016 (exact date).  OBJECTIVE:  She appears well.   ASSESSMENT:  STI Screen   PLAN:  Pt offered STI blood screening-requested GC, chlamydia, trichomonas, BVAG and CVAG probe sent to lab.  Treatment: To be determined once lab results are received.  Pt follow up as needed.

## 2021-09-05 NOTE — Progress Notes (Signed)
Patient was assessed and managed by nursing staff during this encounter. I have reviewed the chart and agree with the documentation and plan. I have also made any necessary editorial changes.  Aletha Halim, MD 09/05/2021 7:19 PM

## 2021-09-06 LAB — RPR+HBSAG+HCVAB+...
HIV Screen 4th Generation wRfx: NONREACTIVE
Hep C Virus Ab: NONREACTIVE
Hepatitis B Surface Ag: NEGATIVE
RPR Ser Ql: NONREACTIVE

## 2021-09-06 LAB — CERVICOVAGINAL ANCILLARY ONLY
Bacterial Vaginitis (gardnerella): NEGATIVE
Candida Glabrata: NEGATIVE
Candida Vaginitis: NEGATIVE
Chlamydia: NEGATIVE
Comment: NEGATIVE
Comment: NEGATIVE
Comment: NEGATIVE
Comment: NEGATIVE
Comment: NEGATIVE
Comment: NORMAL
Neisseria Gonorrhea: NEGATIVE
Trichomonas: NEGATIVE

## 2021-09-06 LAB — HSV(HERPES SIMPLEX VRS) I + II AB-IGG
HSV 1 Glycoprotein G Ab, IgG: <0.91 index (ref 0.00–0.90)
HSV 2 IgG, Type Spec: <0.91 index (ref 0.00–0.90)

## 2021-09-07 ENCOUNTER — Encounter: Payer: Self-pay | Admitting: Advanced Practice Midwife

## 2022-03-06 ENCOUNTER — Ambulatory Visit (INDEPENDENT_AMBULATORY_CARE_PROVIDER_SITE_OTHER): Payer: BC Managed Care – PPO

## 2022-03-06 ENCOUNTER — Other Ambulatory Visit (HOSPITAL_COMMUNITY)
Admission: RE | Admit: 2022-03-06 | Discharge: 2022-03-06 | Disposition: A | Discharge status: Home / Self Care | Payer: BC Managed Care – PPO | Source: Ambulatory Visit | Attending: Obstetrics & Gynecology | Admitting: Obstetrics & Gynecology

## 2022-03-06 VITALS — BP 132/79 | HR 83

## 2022-03-06 DIAGNOSIS — N898 Other specified noninflammatory disorders of vagina: Secondary | ICD-10-CM

## 2022-03-06 DIAGNOSIS — R300 Dysuria: Secondary | ICD-10-CM

## 2022-03-06 NOTE — Progress Notes (Signed)
SUBJECTIVE:  46 y.o. female complains of Vaginal itching,odor and discomfort  for 1 week(s) and  wants urine culture sent notes Hx of UTI's Denies abnormal vaginal bleeding or significant pelvic pain or fever.Denies history of known exposure to STD.  Patient's last menstrual period was 09/09/2016 (exact date).  OBJECTIVE:  She appears alert, well appearing, in no apparent distress Urine dipstick: not done.  ASSESSMENT:  Vaginal Odor Dysuria    PLAN:  GC, chlamydia, trichomonas, BVAG, CVAG probe and urine culture sent to lab. Treatment: To be determined once lab results are received ROV prn if symptoms persist or worsen.

## 2022-03-07 LAB — CERVICOVAGINAL ANCILLARY ONLY
Bacterial Vaginitis (gardnerella): NEGATIVE
Candida Glabrata: NEGATIVE
Candida Vaginitis: NEGATIVE
Chlamydia: NEGATIVE
Comment: NEGATIVE
Comment: NEGATIVE
Comment: NEGATIVE
Comment: NEGATIVE
Comment: NEGATIVE
Comment: NORMAL
Neisseria Gonorrhea: NEGATIVE
Trichomonas: NEGATIVE

## 2022-03-08 LAB — URINE CULTURE

## 2022-04-15 ENCOUNTER — Ambulatory Visit
Admission: EM | Admit: 2022-04-15 | Discharge: 2022-04-15 | Disposition: A | Discharge status: Home / Self Care | Payer: BC Managed Care – PPO | Attending: Physician Assistant | Admitting: Physician Assistant

## 2022-04-15 DIAGNOSIS — Z1152 Encounter for screening for COVID-19: Secondary | ICD-10-CM | POA: Diagnosis present

## 2022-04-15 DIAGNOSIS — J069 Acute upper respiratory infection, unspecified: Secondary | ICD-10-CM | POA: Diagnosis present

## 2022-04-15 LAB — POCT RAPID STREP A (OFFICE): Rapid Strep A Screen: NEGATIVE

## 2022-04-15 MED ORDER — FLUCONAZOLE 150 MG PO TABS: 150.0000 mg | ORAL_TABLET | Freq: Once | ORAL | 0 refills | Status: AC

## 2022-04-15 MED ORDER — PREDNISONE 20 MG PO TABS: 40.0000 mg | ORAL_TABLET | Freq: Every day | ORAL | 0 refills | Status: AC

## 2022-04-15 MED ORDER — AMOXICILLIN 500 MG PO CAPS: 500.0000 mg | ORAL_CAPSULE | Freq: Three times a day (TID) | ORAL | 0 refills | Status: AC

## 2022-04-15 NOTE — ED Triage Notes (Signed)
Pt c/o sore throat, headache, and pressure behind ears and chest that began Friday. The patient states it feels like her tonsils are swollen.   Home interventions: ibuprofen '800mg'$ , coricidin

## 2022-04-15 NOTE — ED Provider Notes (Signed)
[facility]    CSN: [id number] Arrival date & time: 04/15/22  0851      History   Chief Complaint Chief Complaint  Patient presents with   Sore Throat   Ear Fullness    HPI Daisy Baker is a 47 y.o. female.   Patient here today for evaluation of sore throat, headache, ear pressure and chest congestion that began Friday.  She states that she feels as if her tonsils are swollen.  She has been taking ibuprofen and Coricidin without resolution.  She has not had fever.  She denies any vomiting but does have some diarrhea.  She states it is difficult to determine if this is related to underlying IBS.  The history is provided by the patient.    Past Medical History:  Diagnosis Date   Allergy    Anemia    Asthma    Headache    Migraines   PCOS (polycystic ovarian syndrome)    PONV (postoperative nausea and vomiting)    Sleep apnea    does not use CPAP - just picked it up   SVD (spontaneous vaginal delivery)    x 1   Vertigo     Patient Active Problem List   Diagnosis Date Noted   Symptomatic mammary hypertrophy 10/23/2019   Chronic migraine 07/15/2019   Depression 12/03/2018   Essential hypertension 05/13/2018   Prediabetes 03/13/2017   Status post hysterectomy 10/22/2016   Post-operative state 09/20/2016   Yeast infection involving the vagina and surrounding area 06/28/2016   Vaginal irritation 06/28/2016   Bacterial vaginosis 06/28/2016   Morbid obesity ([place]) 06/24/2015   Seasonal allergic rhinitis 06/24/2015   Obstructive sleep apnea of adult 06/24/2015   PCOS (polycystic ovarian syndrome) 04/16/2014    Past Surgical History:  Procedure Laterality Date   ABDOMINAL HYSTERECTOMY N/A 09/20/2016   Procedure: HYSTERECTOMY ABDOMINAL;  Surgeon: Emily Filbert, MD;  Location: [facility];  Service: Gynecology;  Laterality: N/A;   CESAREAN SECTION     DILITATION & CURRETTAGE/HYSTROSCOPY WITH NOVASURE ABLATION N/A 09/02/2015   Procedure: DILATATION & CURETTAGE  WITH ATTEMPTED NOVASURE ABLATION;  Surgeon: Emily Filbert, MD;  Location: [facility];  Service: Gynecology;  Laterality: N/A;   LAPAROSCOPIC TUBAL LIGATION Bilateral 09/02/2015   Procedure: LAPAROSCOPIC BILATERAL SALPINGECTOMY, LYSIS OF ADHESIONS;  Surgeon: Emily Filbert, MD;  Location: [facility];  Service: Gynecology;  Laterality: Bilateral;   MYOMECTOMY  2011   NORPLANT REMOVAL Left 09/02/2015   Procedure: REMOVAL OF NORPLANT;  Surgeon: Emily Filbert, MD;  Location: [facility];  Service: Gynecology;  Laterality: Left;    OB History     Gravida  2   Para  2   Term  1   Preterm  1   AB      Living  2      SAB      IAB      Ectopic      Multiple      Live Births  2            Home Medications    Prior to Admission medications   Medication Sig Start Date End Date Taking? Authorizing Provider  amoxicillin (AMOXIL) 500 MG capsule Take 1 capsule (500 mg total) by mouth 3 (three) times daily. 04/15/22  Yes Francene Finders, PA-C  dicyclomine (BENTYL) 10 MG capsule Take by mouth. 05/02/16  Yes [provider]  fluconazole (DIFLUCAN) 150 MG tablet Take 1 tablet (150 mg total) by  mouth once for 1 dose. 04/15/22 04/15/22 Yes Francene Finders, PA-C  methocarbamol (ROBAXIN) 500 MG tablet Take 500 mg by mouth 4 (four) times daily. 01/24/22  Yes [provider]  montelukast (SINGULAIR) 10 MG tablet Take 1 tablet by mouth daily. 06/05/21  Yes [provider]  predniSONE (DELTASONE) 20 MG tablet Take 2 tablets (40 mg total) by mouth daily with breakfast for 5 days. 04/15/22 04/20/22 Yes Francene Finders, PA-C  albuterol (PROVENTIL HFA;VENTOLIN HFA) 108 (90 Base) MCG/ACT inhaler Inhale 2 puffs into the lungs every 6 (six) hours as needed for wheezing or shortness of breath.  09/12/15 09/11/16  [provider]  amLODipine (NORVASC) 5 MG tablet Take 5 mg by mouth daily.    [provider]  aspirin-acetaminophen-caffeine (EXCEDRIN MIGRAINE) [phone] MG tablet Take 2  tablets by mouth every 6 (six) hours as needed (for migraine headache.).    [provider]  azelastine (ASTELIN) 0.1 % nasal spray Place into both nostrils 2 (two) times daily. Use in each nostril as directed    [provider]  diclofenac Sodium (VOLTAREN) 1 % GEL Apply four times a day as needed for pain 05/10/21   Anyanwu, Sallyanne Havers, MD  dicyclomine (BENTYL) 10 MG capsule Take 10 mg by mouth 4 (four) times daily as needed (for abdominal spasms/ibs).  05/02/16 05/02/17  [provider]  fexofenadine (ALLEGRA) 180 MG tablet Take 180 mg by mouth daily.  01/17/16   [provider]  fluticasone (FLONASE) 50 MCG/ACT nasal spray Place 1 spray into the nose daily as needed (for allergies.).  01/17/16 01/16/17  [provider]  hydrochlorothiazide (HYDRODIURIL) 25 MG tablet Take 25 mg by mouth daily.    [provider]    Family History Family History  Problem Relation Age of Onset   Hypertension Mother    Diabetes Mother    Asthma Mother    Hypertension Father    Diabetes Father     Social History Social History   Tobacco Use   Smoking status: Never   Smokeless tobacco: Never  Vaping Use   Vaping Use: Never used  Substance Use Topics   Alcohol use: Yes    Comment: social   Drug use: No     Allergies   Patient has no known allergies.   Review of Systems Review of Systems  Constitutional:  Negative for chills and fever.  HENT:  Positive for congestion, ear pain and sore throat.   Eyes:  Negative for discharge and redness.  Respiratory:  Positive for cough. Negative for shortness of breath and wheezing.   Gastrointestinal:  Positive for diarrhea. Negative for nausea and vomiting.     Physical Exam Triage Vital Signs ED Triage Vitals  Enc Vitals Group     BP      Pulse      Resp      Temp      Temp src      SpO2      Weight      Height      Head Circumference      Peak Flow      Pain Score      Pain Loc      Pain  Edu?      Excl. in [place]?    No data found.  Updated Vital Signs BP (!) 140/85 (BP Location: Right Arm)   Pulse 96   Temp 98.3 F (36.8 C) (Oral)   Resp 18  LMP 09/09/2016 (Exact Date)   SpO2 96%      Physical Exam Vitals and nursing note reviewed.  Constitutional:      General: She is not in acute distress.    Appearance: Normal appearance. She is not ill-appearing.  HENT:     Head: Normocephalic and atraumatic.     Ears:     Comments: Right TM erythematous, bulging, left TM dull    Nose: Congestion present.     Mouth/Throat:     Mouth: Mucous membranes are moist.     Pharynx: Posterior oropharyngeal erythema present. No oropharyngeal exudate.  Eyes:     Conjunctiva/sclera: Conjunctivae normal.  Cardiovascular:     Rate and Rhythm: Normal rate and regular rhythm.     Heart sounds: Normal heart sounds. No murmur heard. Pulmonary:     Effort: Pulmonary effort is normal. No respiratory distress.     Breath sounds: Normal breath sounds. No wheezing, rhonchi or rales.  Skin:    General: Skin is warm and dry.  Neurological:     Mental Status: She is alert.  Psychiatric:        Mood and Affect: Mood normal.        Thought Content: Thought content normal.      UC Treatments / Results  Labs (all labs ordered are listed, but only abnormal results are displayed) Labs Reviewed  SARS CORONAVIRUS 2 (TAT 6-24 HRS)  POCT RAPID STREP A (OFFICE)    EKG   Radiology No results found.  Procedures Procedures (including critical care time)  Medications Ordered in UC Medications - No data to display  Initial Impression / Assessment and Plan / UC Course  I have reviewed the triage vital signs and the nursing notes.  Pertinent labs & imaging results that were available during my care of the patient were reviewed by me and considered in my medical decision making (see chart for details).    Strep test negative in office. Will treat to cover otitis media with steroid burst  and will also trial steroid burst for inflammation of tonsils, throat discomfort. Discussed that symptoms could be viral in nature- will order covid screening. Low suspicion of flu given lack of fever. Encouraged follow up if no gradual improvement or with any further concerns. Patient requests diflucan to cover possible yeast infection with antibiotic therapy- diflucan prescribed.   Final Clinical Impressions(s) / UC Diagnoses   Final diagnoses:  Acute upper respiratory infection  Encounter for screening for COVID-19   Discharge Instructions   None    ED Prescriptions     Medication Sig Dispense Auth. Provider   amoxicillin (AMOXIL) 500 MG capsule Take 1 capsule (500 mg total) by mouth 3 (three) times daily. 21 capsule Ewell Poe F, PA-C   predniSONE (DELTASONE) 20 MG tablet Take 2 tablets (40 mg total) by mouth daily with breakfast for 5 days. 10 tablet Ewell Poe F, PA-C   fluconazole (DIFLUCAN) 150 MG tablet Take 1 tablet (150 mg total) by mouth once for 1 dose. 1 tablet Francene Finders, PA-C      PDMP not reviewed this encounter.   Francene Finders, PA-C 04/16/22 1350

## 2022-04-16 LAB — SARS CORONAVIRUS 2 (TAT 6-24 HRS): SARS Coronavirus 2: NEGATIVE

## 2022-08-01 ENCOUNTER — Other Ambulatory Visit: Payer: Self-pay | Admitting: Obstetrics & Gynecology

## 2022-08-01 DIAGNOSIS — B3731 Acute candidiasis of vulva and vagina: Secondary | ICD-10-CM

## 2023-04-19 IMAGING — MG DIGITAL DIAGNOSTIC BILAT W/ TOMO W/ CAD
4 series · 4 of 12 positions shown · non-contrast
Comparison: Previous exam(s).

CLINICAL DATA: Recent bilateral nipple pain which has resolved.

EXAM:
DIGITAL DIAGNOSTIC BILATERAL MAMMOGRAM WITH TOMOSYNTHESIS AND CAD
TECHNIQUE: Bilateral digital diagnostic mammography and breast tomosynthesis
was performed. The images were evaluated with computer-aided
detection.

[R TAN synth-2D]
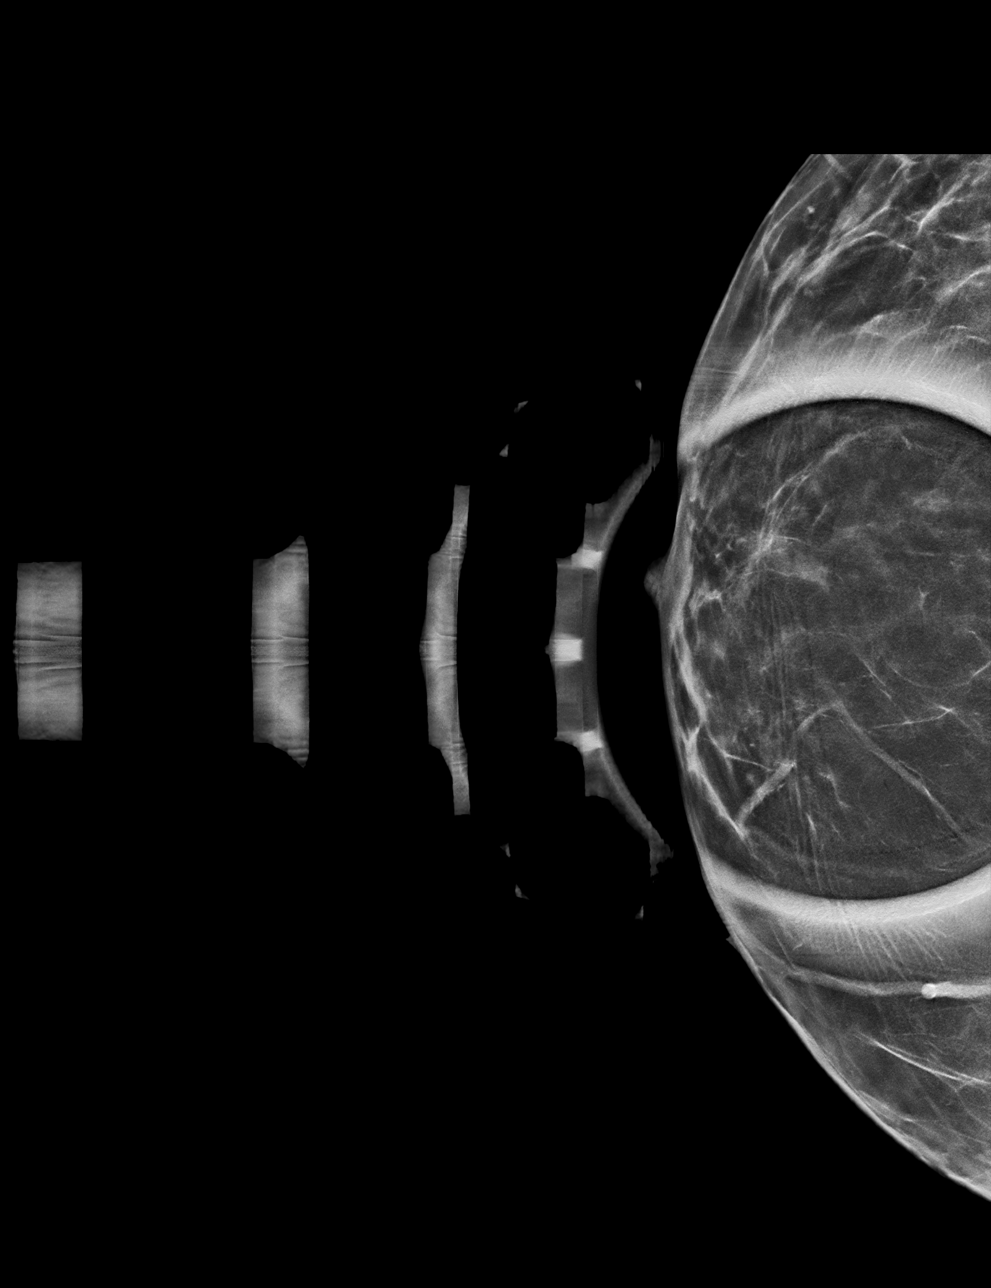

[L TAN synth-2D]
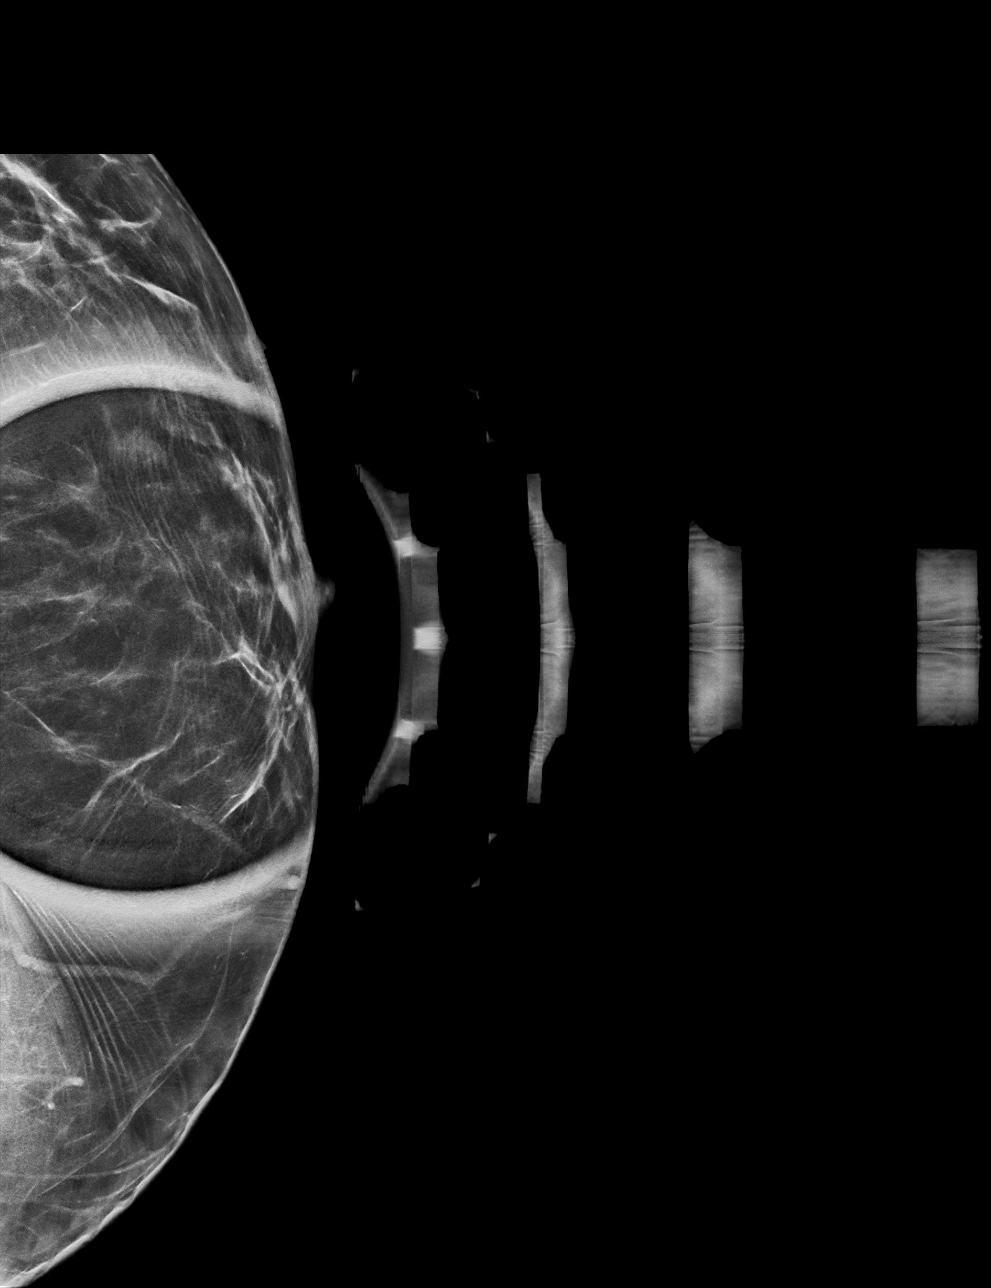

[L TAN tomo · tomo slice 29/57.0]
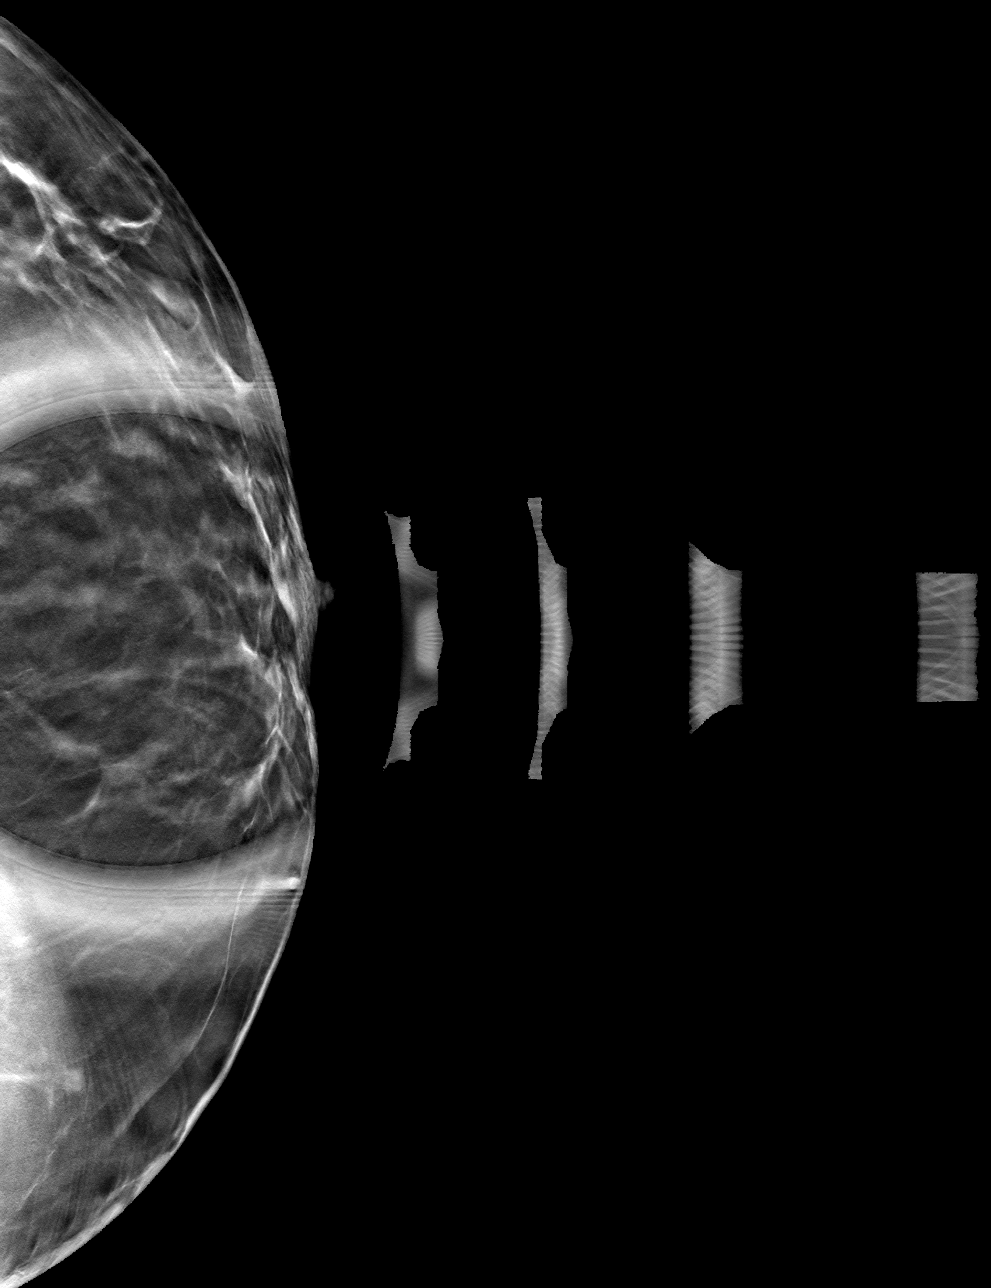

[R TAN tomo · tomo slice 28/55.0]
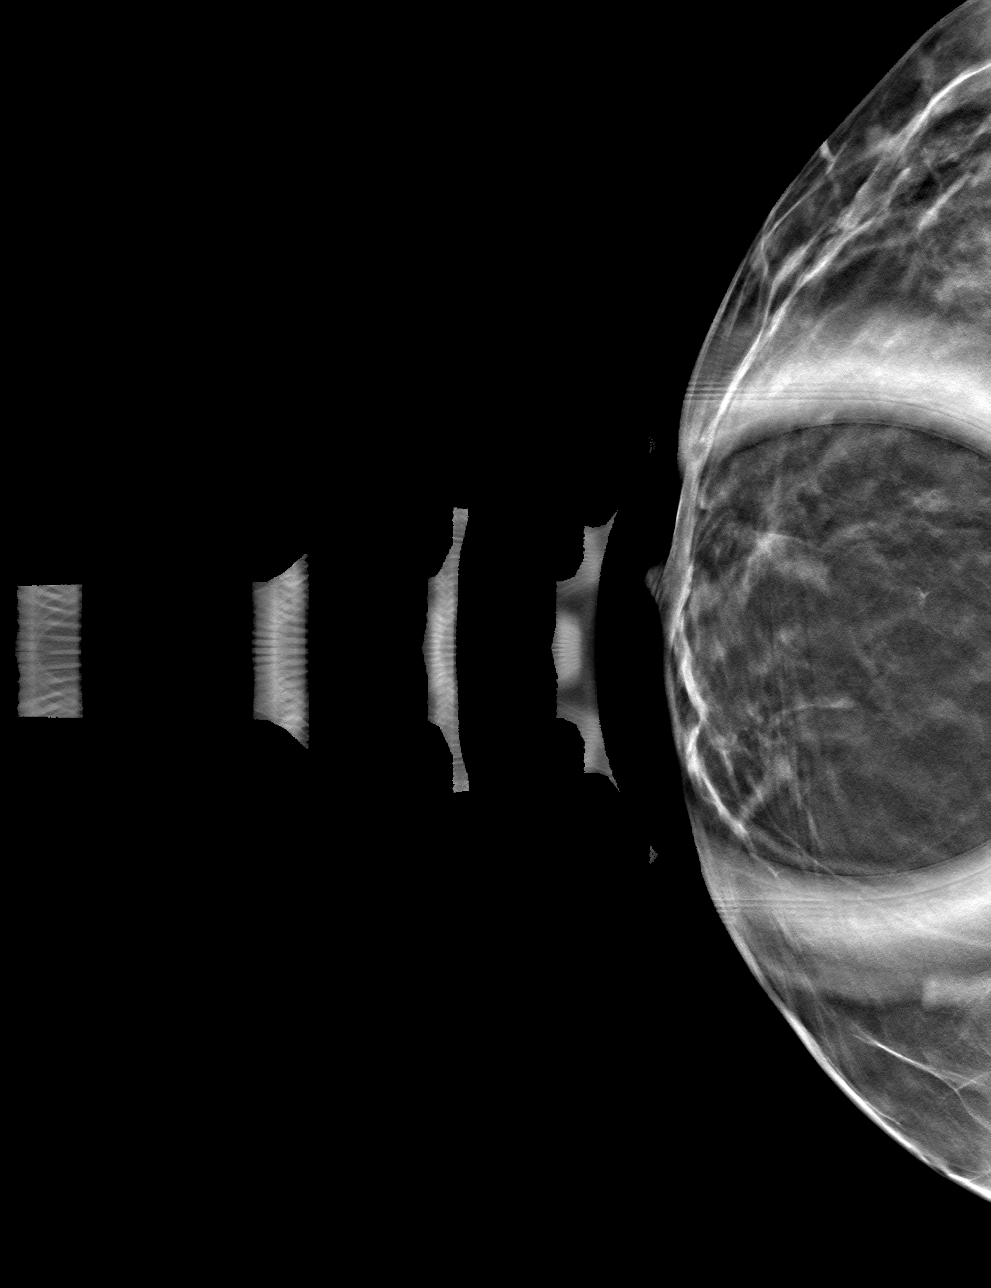

[4 of 12 positions shown; findings below may reference images not displayed]

ACR Breast Density Category b: There are scattered areas of
fibroglandular density.
FINDINGS: No suspicious masses, calcifications, or distortion identified.
IMPRESSION: No mammographic evidence of malignancy.

RECOMMENDATION:
Annual screening mammography.

I have discussed the findings and recommendations with the patient.
If applicable, a reminder letter will be sent to the patient
regarding the next appointment.

BI-RADS CATEGORY  1: Negative.

## 2023-05-15 ENCOUNTER — Other Ambulatory Visit: Payer: Self-pay | Admitting: Obstetrics & Gynecology

## 2023-05-15 DIAGNOSIS — B3731 Acute candidiasis of vulva and vagina: Secondary | ICD-10-CM

## 2023-11-25 ENCOUNTER — Ambulatory Visit: Admitting: Podiatry

## 2024-01-08 ENCOUNTER — Other Ambulatory Visit: Payer: Self-pay | Admitting: Obstetrics & Gynecology

## 2024-01-08 DIAGNOSIS — B3731 Acute candidiasis of vulva and vagina: Secondary | ICD-10-CM
